# Patient Record
Sex: Male | Born: 1975 | Race: Black or African American | Hispanic: No | Marital: Single | Smoking: Never smoker
Health system: Southern US, Community
[De-identification: ages and names within clinical notes are randomized; demographics above are authoritative.]

---

## 2009-08-04 ENCOUNTER — Emergency Department (HOSPITAL_COMMUNITY): Admission: EM | Admit: 2009-08-04 | Discharge: 2009-08-04 | Payer: Self-pay | Admitting: Family Medicine

## 2011-05-14 ENCOUNTER — Emergency Department (INDEPENDENT_AMBULATORY_CARE_PROVIDER_SITE_OTHER)
Admission: EM | Admit: 2011-05-14 | Discharge: 2011-05-14 | Disposition: A | Discharge status: Home / Self Care | Payer: 59 | Source: Home / Self Care

## 2011-05-14 ENCOUNTER — Encounter (HOSPITAL_COMMUNITY): Payer: Self-pay

## 2011-05-14 DIAGNOSIS — J069 Acute upper respiratory infection, unspecified: Secondary | ICD-10-CM

## 2011-05-14 DIAGNOSIS — H6120 Impacted cerumen, unspecified ear: Secondary | ICD-10-CM

## 2011-05-14 DIAGNOSIS — R0789 Other chest pain: Secondary | ICD-10-CM

## 2011-05-14 DIAGNOSIS — R071 Chest pain on breathing: Secondary | ICD-10-CM

## 2011-05-14 MED ORDER — IBUPROFEN 800 MG PO TABS
800.0000 mg | ORAL_TABLET | Freq: Once | ORAL | Status: AC
  Administered 2011-05-14: 800 mg via ORAL

## 2011-05-14 MED ORDER — IBUPROFEN 800 MG PO TABS: 800.0000 mg | ORAL_TABLET | Freq: Three times a day (TID) | ORAL | Status: AC

## 2011-05-14 MED ORDER — IBUPROFEN 800 MG PO TABS
ORAL_TABLET | ORAL | Status: AC
  Filled 2011-05-14: qty 1

## 2011-05-14 NOTE — ED Provider Notes (Signed)
History     CSN: [id number]  Arrival date & time 05/14/11  0818   None     Chief Complaint  Patient presents with  . URI    (Consider location/radiation/quality/duration/timing/severity/associated sxs/prior treatment) HPI Comments: Patient presents with complaints of nasal congestion, sneezing and cough for the last 5 days. He states when he sneezes or coughs his chest hurts. He has no dyspnea or wheezing. He is also beginning to develop some left ear discomfort as well. He has no rhinorrhea. His cough is nonproductive as well. He has been taking over-the-counter cough medication without improvement.   History reviewed. No pertinent past medical history.  History reviewed. No pertinent past surgical history.  History reviewed. No pertinent family history.  History  Substance Use Topics  . Smoking status: Never Smoker   . Smokeless tobacco: Not on file  . Alcohol Use: No      Review of Systems  Constitutional: Negative for fever, chills and fatigue.  HENT: Positive for ear pain, congestion and sneezing. Negative for sore throat, rhinorrhea, postnasal drip and sinus pressure.   Respiratory: Positive for cough. Negative for shortness of breath and wheezing.     Allergies  Review of patient's allergies indicates no known allergies.  Home Medications   Current Outpatient Rx  Name Route Sig Dispense Refill  . IBUPROFEN 800 MG PO TABS Oral Take 1 tablet (800 mg total) by mouth 3 (three) times daily. 15 tablet 0    BP 140/86  Pulse 85  Temp(Src) 98.4 F (36.9 C) (Oral)  SpO2 98%  Physical Exam  Nursing note and vitals reviewed. Constitutional: He appears well-developed and well-nourished. No distress.  HENT:  Head: Normocephalic and atraumatic.  Right Ear: External ear normal.  Left Ear: External ear normal.  Nose: Nose normal.  Mouth/Throat: Uvula is midline, oropharynx is clear and moist and mucous membranes are normal. No oropharyngeal exudate, posterior  oropharyngeal edema or posterior oropharyngeal erythema.       Bilat EAC obstructed with soft cerumen, Lt > Rt. After ear irrigation bilat TMs and EAC neg.   Neck: Neck supple.  Cardiovascular: Normal rate, regular rhythm and normal heart sounds.   Pulmonary/Chest: Effort normal and breath sounds normal. No respiratory distress. He exhibits no tenderness.  Lymphadenopathy:    He has no cervical adenopathy.  Neurological: He is alert.  Skin: Skin is warm and dry.  Psychiatric: He has a normal mood and affect.    ED Course  Procedures (including critical care time)  Labs Reviewed - No data to display No results found.   1. Acute URI   2. Cerumen impaction   3. Chest wall pain       MDM  Cerumen impaction cleared with ear irrigation. Chest wall pain with cough and sneeze.         Melody Comas, [place] 05/14/11 [phone]

## 2011-05-14 NOTE — ED Notes (Signed)
C/o runny nose, stuffy nose, cough, headache, lt ear pain and chest hurts with coughing.  Sx started on Sunday with runny nose and nasal congestion and have progressively worsened.  No known fever.

## 2011-05-17 NOTE — ED Provider Notes (Signed)
Medical screening examination/treatment/procedure(s) were performed by non-physician practitioner and as supervising physician I was immediately available for consultation/collaboration.   Elaysia Devargas DOUGLAS MD.    Damascus Feldpausch Douglas Sherl Yzaguirre, MD 05/17/11 1346 

## 2011-05-30 ENCOUNTER — Ambulatory Visit (INDEPENDENT_AMBULATORY_CARE_PROVIDER_SITE_OTHER): Payer: 59 | Admitting: Internal Medicine

## 2011-05-30 ENCOUNTER — Encounter: Payer: Self-pay | Admitting: Internal Medicine

## 2011-05-30 VITALS — BP 137/84 | HR 85 | Temp 98.3°F | Resp 16 | Ht 70.0 in | Wt 270.0 lb

## 2011-05-30 DIAGNOSIS — K047 Periapical abscess without sinus: Secondary | ICD-10-CM

## 2011-05-30 MED ORDER — AMOXICILLIN 500 MG PO CAPS: 1000.0000 mg | ORAL_CAPSULE | Freq: Two times a day (BID) | ORAL | Status: AC

## 2011-05-30 MED ORDER — AMOXICILLIN 500 MG PO CAPS: 500.0000 mg | ORAL_CAPSULE | Freq: Three times a day (TID) | ORAL | Status: DC

## 2011-05-30 MED ORDER — HYDROCODONE-ACETAMINOPHEN 7.5-750 MG PO TABS: 1.0000 | ORAL_TABLET | Freq: Three times a day (TID) | ORAL | Status: AC | PRN

## 2011-05-30 NOTE — Progress Notes (Signed)
Is 36 year old man presents with progressive pain in his left jaw left ear and the left side of his face over the past 7-10 days. This follows an upper inspiratory infection for which she had an ER visit at which time only ibuprofen was prescribed. The pain is so intense he couldn't sleep last night. He has a regular dentist  He is on no medicines and has no current illnesses   Exam reveals a swollen red gum, around the lower left molar that is very tender to touch there is no pointing abscess to [facility] Dear tender nodes at the left anterior cervical area high angle of the jaw The nares are clear and the left ear is clear.   Problem #1 dental abscess Amoxicillin and hydrocodone will be used and he will follow up with his dentist

## 2011-09-23 ENCOUNTER — Encounter (HOSPITAL_COMMUNITY): Payer: Self-pay | Admitting: Emergency Medicine

## 2011-09-23 ENCOUNTER — Emergency Department (HOSPITAL_COMMUNITY)
Admission: EM | Admit: 2011-09-23 | Discharge: 2011-09-23 | Disposition: A | Discharge status: Home / Self Care | Payer: 59 | Source: Home / Self Care

## 2011-09-23 DIAGNOSIS — R059 Cough, unspecified: Secondary | ICD-10-CM

## 2011-09-23 DIAGNOSIS — R05 Cough: Secondary | ICD-10-CM

## 2011-09-23 MED ORDER — FLUTICASONE PROPIONATE 50 MCG/ACT NA SUSP: 2.0000 | Freq: Every day | NASAL | Status: DC

## 2011-09-23 MED ORDER — ESOMEPRAZOLE MAGNESIUM 40 MG PO CPDR: 40.0000 mg | DELAYED_RELEASE_CAPSULE | Freq: Every day | ORAL | Status: AC

## 2011-09-23 MED ORDER — GUAIFENESIN-CODEINE 100-10 MG/5ML PO SYRP: 5.0000 mL | ORAL_SOLUTION | Freq: Three times a day (TID) | ORAL | Status: AC | PRN

## 2011-09-23 NOTE — Discharge Instructions (Signed)
Thank you for coming in today. I think the most likely explanation for your cough is allergies. Please take Flonase daily for at least one month. Please also try Nexium for heartburn, as this may be a cause of cough.  If you do not get better within one month please make an appointment for the family practice. When you call [phone] make sure to tell them that your employee of [employer]. Call or go to the emergency room if you get worse, have trouble breathing, have chest pains, or palpitations.

## 2011-09-23 NOTE — ED Provider Notes (Signed)
Tyler Hensley is a 36 y.o. male who presents to Urgent Care today for cough for 3 weeks. Patient notes a cough productive of clear sputum. He denies any trouble breathing chest pains or palpitations. He had some allergy symptoms initially however those have resolved. He has tried Claritin and several other over-the-counter cough medicines that have not helped. He denies any heartburn nausea vomiting or diarrhea. He also denies any wheezing.    PMH reviewed. Otherwise healthy young man History  Substance Use Topics  . Smoking status: Never Smoker   . Smokeless tobacco: Not on file  . Alcohol Use: No   ROS as above Medications reviewed. No current facility-administered medications for this encounter.   Current Outpatient Prescriptions  Medication Sig Dispense Refill  . OVER THE COUNTER MEDICATION dayquil      . esomeprazole (NEXIUM) 40 MG capsule Take 1 capsule (40 mg total) by mouth daily before breakfast.  30 capsule  3  . fluticasone (FLONASE) 50 MCG/ACT nasal spray Place 2 sprays into the nose daily.  16 g  2  . guaiFENesin-codeine (ROBITUSSIN AC) 100-10 MG/5ML syrup Take 5 mLs by mouth 3 (three) times daily as needed for cough.  120 mL  0    Exam:  BP 138/98  Pulse 88  Temp(Src) 98.6 F (37 C) (Oral)  Resp 22  SpO2 99% Gen: Well NAD HEENT: EOMI,  MMM, posterior pharynx shows cobblestoning Lungs: CTABL Nl WOB Heart: RRR no MRG Abd: NABS, NT, ND Exts: Non edematous BL  LE, warm and well perfused.   No results found for this or any previous visit (from the past 24 hour(s)). No results found.  Assessment and Plan: 36 y.o. male with cough. I think the most likely sedation for his cough is postnasal drip. Plan to treat with Flonase for at least one month and followup at [facility] as needed. Additionally and other common causes of chronic cough is reflux. We'll try treating empirically with Nexium. Discussed the plan with patient who expresses  understanding. Additionally discuss red flag signs or symptoms. Please see discharge instructions.     Rodolph Bong, MD 09/23/11 2019

## 2011-09-23 NOTE — ED Notes (Addendum)
initially a headache, cough, then runny nose, chest congestion, cough for 3 weeks.  Cough will not go away.  Coughing up yellow phlegm

## 2011-09-24 NOTE — ED Provider Notes (Signed)
Medical screening examination/treatment/procedure(s) were performed by the resident and as supervising physician I was immediately available for consultation/collaboration.  Maleeha Halls M. MD  Xylon Croom M Telecia Larocque, MD 09/24/11 1255 

## 2013-07-06 ENCOUNTER — Emergency Department (INDEPENDENT_AMBULATORY_CARE_PROVIDER_SITE_OTHER): Payer: 59

## 2013-07-06 ENCOUNTER — Emergency Department (HOSPITAL_COMMUNITY)
Admission: EM | Admit: 2013-07-06 | Discharge: 2013-07-06 | Disposition: A | Discharge status: Home / Self Care | Payer: 59 | Source: Home / Self Care | Attending: Family Medicine | Admitting: Family Medicine

## 2013-07-06 ENCOUNTER — Encounter (HOSPITAL_COMMUNITY): Payer: Self-pay | Admitting: Emergency Medicine

## 2013-07-06 DIAGNOSIS — J40 Bronchitis, not specified as acute or chronic: Secondary | ICD-10-CM

## 2013-07-06 MED ORDER — HYDROCODONE-HOMATROPINE 5-1.5 MG/5ML PO SYRP: 5.0000 mL | ORAL_SOLUTION | Freq: Four times a day (QID) | ORAL | Status: DC | PRN

## 2013-07-06 MED ORDER — AZITHROMYCIN 250 MG PO TABS
250.0000 mg | ORAL_TABLET | Freq: Every day | ORAL | Status: DC
Start: 2013-07-06 — End: 2017-12-14

## 2013-07-06 NOTE — ED Provider Notes (Signed)
CSN: [id number]     Arrival date & time 07/06/13  1720 History   First MD Initiated Contact with Patient 07/06/13 1843     Chief Complaint  Patient presents with  . URI   (Consider location/radiation/quality/duration/timing/severity/associated sxs/prior Treatment) Patient is a 38 y.o. male presenting with URI. The history is provided by the patient. No language interpreter was used.  URI Presenting symptoms: congestion and cough   Severity:  Moderate Onset quality:  Gradual Duration:  7 days Timing:  Constant Progression:  Worsening Chronicity:  New Relieved by:  Nothing Worsened by:  Nothing tried Ineffective treatments:  None tried Risk factors: sick contacts   Risk factors: not elderly     History reviewed. No pertinent past medical history. History reviewed. No pertinent past surgical history. History reviewed. No pertinent family history. History  Substance Use Topics  . Smoking status: Never Smoker   . Smokeless tobacco: Not on file  . Alcohol Use: No    Review of Systems  HENT: Positive for congestion.   Respiratory: Positive for cough.   All other systems reviewed and are negative.    Allergies  Review of patient's allergies indicates no known allergies.  Home Medications   Current Outpatient Rx  Name  Route  Sig  Dispense  Refill  . OVER THE COUNTER MEDICATION      dayquil         . EXPIRED: esomeprazole (NEXIUM) 40 MG capsule   Oral   Take 1 capsule (40 mg total) by mouth daily before breakfast.   30 capsule   3   . EXPIRED: fluticasone (FLONASE) 50 MCG/ACT nasal spray   Nasal   Place 2 sprays into the nose daily.   16 g   2    BP 141/84  Pulse 103  Temp(Src) 98.5 F (36.9 C) (Oral)  Resp 16  SpO2 98% Physical Exam  Nursing note and vitals reviewed. Constitutional: He is oriented to person, place, and time. He appears well-developed and well-nourished.  HENT:  Head: Normocephalic and atraumatic.  Eyes: Conjunctivae and EOM are  normal. Pupils are equal, round, and reactive to light.  Neck: Normal range of motion.  Cardiovascular: Normal rate and regular rhythm.   Pulmonary/Chest: Effort normal and breath sounds normal.  Abdominal: Soft. He exhibits no distension.  Musculoskeletal: Normal range of motion.  Neurological: He is alert and oriented to person, place, and time.  Skin: Skin is warm.  Psychiatric: He has a normal mood and affect.    ED Course  Procedures (including critical care time) Labs Review Labs Reviewed - No data to display Imaging Review Dg Chest 2 View  07/06/2013   CLINICAL DATA:  Cough since 06/29/2013, fever, URI  EXAM: CHEST  2 VIEW  COMPARISON:  None  FINDINGS: Normal heart size, mediastinal contours, and pulmonary vascularity.  Lungs clear.  Bones unremarkable.  No pneumothorax.  IMPRESSION: Normal exam.   Electronically Signed   By: Ulyses SouthwardMark  Boles M.D.   On: 07/06/2013 19:10     MDM   1. Bronchitis    No pneumonia.   Pt given rx for zithromax and hydromet for cough.   I advised recheck if symptoms worsen or change.    Lonia SkinnerLeslie K GlenvilleSofia, PA-C 07/06/13 2005

## 2013-07-06 NOTE — Discharge Instructions (Signed)

## 2013-07-06 NOTE — ED Notes (Signed)
C/o  headache on 3/13, with runny nose, cough, sneezing.  Vomited x 5 last night.  No diarrhea.  C/o bil. flank pain.  Fever on Sat.

## 2013-07-07 NOTE — ED Provider Notes (Signed)
Medical screening examination/treatment/procedure(s) were performed by a resident physician or non-physician practitioner and as the supervising physician I was immediately available for consultation/collaboration.  Marshelle Bilger, MD    Nikolis Berent S Christiann Hagerty, MD 07/07/13 0843 

## 2015-01-28 ENCOUNTER — Ambulatory Visit (HOSPITAL_BASED_OUTPATIENT_CLINIC_OR_DEPARTMENT_OTHER): Payer: 59 | Attending: Family Medicine | Admitting: Radiology

## 2015-01-28 DIAGNOSIS — G4710 Hypersomnia, unspecified: Secondary | ICD-10-CM | POA: Insufficient documentation

## 2015-01-28 DIAGNOSIS — G4733 Obstructive sleep apnea (adult) (pediatric): Secondary | ICD-10-CM | POA: Insufficient documentation

## 2015-01-28 DIAGNOSIS — R0683 Snoring: Secondary | ICD-10-CM | POA: Insufficient documentation

## 2015-02-08 DIAGNOSIS — G4733 Obstructive sleep apnea (adult) (pediatric): Secondary | ICD-10-CM | POA: Diagnosis not present

## 2015-02-08 DIAGNOSIS — R0683 Snoring: Secondary | ICD-10-CM | POA: Diagnosis not present

## 2015-02-08 NOTE — Progress Notes (Signed)
  Patient Name: Tyler Hensley, Tyler Hensley Study Date: 01/28/2015 Gender: Male D.O.B: 29-Apr-1975 Age (years): 38 Referring Provider: Farris HasAaron Morrow Height (inches): 69 Interpreting Physician: Jetty Duhamellinton Young MD, ABSM Weight (lbs): 300 RPSGT: [place], [place] BMI: 44 MRN: [id number] Neck Size: 17.00 CLINICAL INFORMATION Sleep Study Type: Unattended Home Sleep Test   Indication for sleep study: 780.54 Hypersomnia, OSA, Snoring   Epworth Sleepiness Score: 9 SLEEP STUDY TECHNIQUE A multi-channel overnight portable sleep study was performed. The channels recorded were: nasal airflow, thoracic respiratory movement, and oxygen saturation with a pulse oximetry. Snoring was also monitored. MEDICATIONS Patient self administered medications include: none reported.  SLEEP ARCHITECTURE Patient was studied for 388.2 minutes. The sleep efficiency was 99.9 % and the patient was supine for 100%. The arousal index was 0.0 per hour.  RESPIRATORY PARAMETERS The overall AHI was 6.2 per hour, with a central apnea index of 1.1 per hour. The oxygen nadir was 88% during sleep.  CARDIAC DATA Mean heart rate during sleep was 83.8 bpm.  IMPRESSIONS - Mild obstructive sleep apnea occurred during this study (AHI = 6.2/h). - No significant central sleep apnea occurred during this study (CAI = 1.1/h). - Mild oxygen desaturation was noted during this study (Min O2 = 88%). - Patient snored 47.6% during the sleep.  DIAGNOSIS - Obstructive Sleep Apnea (327.23 [G47.33 ICD-10])  RECOMMENDATIONS - If conservative measures are not sufficient, then an oral appliance or CPAP might be considered on an individual basis. - Positional therapy avoiding supine position during sleep. - Avoid alcohol, sedatives and other CNS depressants that may worsen sleep apnea and disrupt normal sleep architecture. - Sleep hygiene should be reviewed to assess factors that may improve sleep quality. - Weight management and regular exercise  should be initiated or continued. Waymon Budge-   YOUNG,CLINTON D Diplomate, American Board of Sleep Medicine  ELECTRONICALLY SIGNED ON:  02/08/2015, 10:08 AM [facility] PH: [phone]   FX: [phone] ACCREDITED BY THE AMERICAN ACADEMY OF SLEEP MEDICINE

## 2015-05-10 DIAGNOSIS — G4733 Obstructive sleep apnea (adult) (pediatric): Secondary | ICD-10-CM | POA: Diagnosis not present

## 2015-06-10 DIAGNOSIS — G4733 Obstructive sleep apnea (adult) (pediatric): Secondary | ICD-10-CM | POA: Diagnosis not present

## 2015-07-08 DIAGNOSIS — G4733 Obstructive sleep apnea (adult) (pediatric): Secondary | ICD-10-CM | POA: Diagnosis not present

## 2015-07-29 DIAGNOSIS — G4733 Obstructive sleep apnea (adult) (pediatric): Secondary | ICD-10-CM | POA: Diagnosis not present

## 2015-08-08 DIAGNOSIS — G4733 Obstructive sleep apnea (adult) (pediatric): Secondary | ICD-10-CM | POA: Diagnosis not present

## 2015-09-07 DIAGNOSIS — G4733 Obstructive sleep apnea (adult) (pediatric): Secondary | ICD-10-CM | POA: Diagnosis not present

## 2015-09-29 DIAGNOSIS — Z0000 Encounter for general adult medical examination without abnormal findings: Secondary | ICD-10-CM | POA: Diagnosis not present

## 2015-09-29 DIAGNOSIS — Z13220 Encounter for screening for lipoid disorders: Secondary | ICD-10-CM | POA: Diagnosis not present

## 2015-09-29 DIAGNOSIS — Z131 Encounter for screening for diabetes mellitus: Secondary | ICD-10-CM | POA: Diagnosis not present

## 2015-09-29 DIAGNOSIS — R05 Cough: Secondary | ICD-10-CM | POA: Diagnosis not present

## 2015-09-29 DIAGNOSIS — E669 Obesity, unspecified: Secondary | ICD-10-CM | POA: Diagnosis not present

## 2015-09-29 MED FILL — FLUTICASONE PROP 50 MCG SPR: 50 | 60 days supply | Qty: 16 | Fill #0

## 2015-09-29 MED FILL — AZITHROMYCIN 250 MG TABLET: 250 | 5 days supply | Qty: 6 | Fill #0

## 2015-10-25 DIAGNOSIS — H52223 Regular astigmatism, bilateral: Secondary | ICD-10-CM | POA: Diagnosis not present

## 2015-10-25 DIAGNOSIS — H5203 Hypermetropia, bilateral: Secondary | ICD-10-CM | POA: Diagnosis not present

## 2016-02-10 DIAGNOSIS — J069 Acute upper respiratory infection, unspecified: Secondary | ICD-10-CM | POA: Diagnosis not present

## 2016-02-10 DIAGNOSIS — B9789 Other viral agents as the cause of diseases classified elsewhere: Secondary | ICD-10-CM | POA: Diagnosis not present

## 2016-05-07 DIAGNOSIS — R197 Diarrhea, unspecified: Secondary | ICD-10-CM | POA: Diagnosis not present

## 2016-05-11 DIAGNOSIS — K529 Noninfective gastroenteritis and colitis, unspecified: Secondary | ICD-10-CM | POA: Diagnosis not present

## 2016-06-16 ENCOUNTER — Other Ambulatory Visit: Payer: Self-pay
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
## 2016-06-21 ENCOUNTER — Other Ambulatory Visit: Payer: Self-pay | Admitting: *Deleted

## 2016-06-21 ENCOUNTER — Encounter: Payer: Self-pay | Admitting: *Deleted

## 2016-06-21 NOTE — Patient Outreach (Signed)
[employer] [facility]) Care Management  06/21/2016  Tyler Hensley 1975-09-24 [id number]   CSW was able to make initial contact with patient today to perform phone assessment, as well as assess and assist with social work needs and services.  CSW introduced self, explained role and types of services provided through [facility] ([facility]).  CSW further explained to patient that CSW works with patient's RNCM, also with [place] [facility], Tyler Hensley. CSW then explained the reason for the call, indicating that Tyler Hensley thought that patient would benefit from social work services and resources to assist with counseling and supportive services, as well as a referral for medication management and psychotherapy.  CSW obtained two HIPAA compliant identifiers from patient, which included patient's name and date of birth. Patient was extremely pleasant on the phone and most appreciative of the call.  Patient admitted to having a "bad day" when he last spoke with Tyler Hensley, reporting that he has "since worked it out in his mind".  Patient went on to say that his reasons for feeling depressed "came to him in a dream over the weekend".  Patient stated, "I know it sounds crazy, but in my dream, I realized that it's during the most drastic changes in my life that I become unsettled".  Patient indicated that he was referring to the fact that he will be turning "41" this year, which is a "huge milestone" in his mind.  Patient admitted to going through the same type of depression when he turned "59" years of age.  Patient denied feeling homicidal and/or suicidal, admitting that his wife, Tyler Hensley would be his deterrent from ever being able to harm himself.  CSW inquired as to how patient plans to cope with this realization.  Patient reported, "Well now that I know why I am having these feelings, I can learn to deal with them".  CSW spoke with patient about deep  breathing exercises, as well as relaxation techniques, for when patient becomes overwhelmed, or when feelings of depression become too much for him to bare. Patient was not interested in receiving a referral for counseling and/or supportive services, nor was patient interested in a list of counselors/therapists that accept [facility].  Patient denied being on any type of psychotropic medications, which CSW was able to confirm, and did not appear to be interested in having anything prescribed for him by his primary care physician.   Patient did not wish for CSW to contact him at his home number, reporting that Mrs. Hensley is unaware of his recent feelings of depression and suicidality, nor was patient agreeable to having CSW mail any resource material to his home address, for fear that Mrs. Hensley would open the packet and review the contents, becoming upset with him for not disclosing his feelings to her directly.  Patient was agreeable to having CSW email him the following list of resource information to his [place] account:   EACP ([place]) - # [phone] and/or toll free - # [phone] [facility] ([facility]) [facility] - # [phone] EACP entitles patient to three free visits, with a referral for continued services, if necessary.  [facility] requires a $20-$25.00 copay per visit, with unlimited visits.   Deep Breathing Exercises Relaxation Techniques Signs and Symptoms of Depression Treatment Techniques for Coping with Depression CSW was able to confirm that patient received the resource information emailed to him by CSW.  Patient denied need for follow-up from [place], but agreed to take down CSW's contact information, in the event that patient had questions or needed social work assistance in the near future. CSW will perform a case closure on patient, as all goals of treatment have been met from social work  standpoint and no additional social work needs have been identified at this time.  CSW will notify patient's RNCM with [facility], Tyler Hensley of CSW's plans to close patient's case.  CSW will submit a case closure request to Josepha Pigg, Care Management Assistant with [place] [facility], in the form of an [employer].   Tyler Hensley, BSW, MSW, LCSW  Licensed Education officer, [facility]  [address] [place] [address] [place], [place] [address] Physical Address-[address], [place], [place] [address] Toll Free Main # [phone] Fax # [phone] Cell # [phone]  Office # [phone] [email or web address]

## 2016-10-04 DIAGNOSIS — E669 Obesity, unspecified: Secondary | ICD-10-CM | POA: Diagnosis not present

## 2016-10-04 DIAGNOSIS — Z0000 Encounter for general adult medical examination without abnormal findings: Secondary | ICD-10-CM | POA: Diagnosis not present

## 2016-10-13 DIAGNOSIS — R05 Cough: Secondary | ICD-10-CM | POA: Diagnosis not present

## 2016-10-13 MED FILL — AZITHROMYCIN 250 MG TAB: 250 | 5 days supply | Qty: 6 | Fill #0

## 2016-10-13 MED FILL — PROMETHAZINE-CODEINE SYRUP: 6.25-10 | 5 days supply | Qty: 100 | Fill #0

## 2017-06-12 DIAGNOSIS — M791 Myalgia, unspecified site: Secondary | ICD-10-CM | POA: Diagnosis not present

## 2017-06-12 DIAGNOSIS — B349 Viral infection, unspecified: Secondary | ICD-10-CM | POA: Diagnosis not present

## 2017-06-12 DIAGNOSIS — R509 Fever, unspecified: Secondary | ICD-10-CM | POA: Diagnosis not present

## 2017-07-07 DIAGNOSIS — R062 Wheezing: Secondary | ICD-10-CM | POA: Diagnosis not present

## 2017-07-07 DIAGNOSIS — J069 Acute upper respiratory infection, unspecified: Secondary | ICD-10-CM | POA: Diagnosis not present

## 2017-07-07 MED FILL — AZITHROMYCIN 250 MG TABS: 250 | 5 days supply | Qty: 6 | Fill #0

## 2017-07-07 MED FILL — VENTOLIN HFA 90 MCG INHALER: 108 (90 BAS | 25 days supply | Qty: 18 | Fill #0

## 2017-09-27 DIAGNOSIS — R002 Palpitations: Secondary | ICD-10-CM | POA: Diagnosis not present

## 2017-10-06 DIAGNOSIS — Z131 Encounter for screening for diabetes mellitus: Secondary | ICD-10-CM | POA: Diagnosis not present

## 2017-10-06 DIAGNOSIS — Z0000 Encounter for general adult medical examination without abnormal findings: Secondary | ICD-10-CM | POA: Diagnosis not present

## 2017-10-06 DIAGNOSIS — Z136 Encounter for screening for cardiovascular disorders: Secondary | ICD-10-CM | POA: Diagnosis not present

## 2017-10-06 DIAGNOSIS — Z125 Encounter for screening for malignant neoplasm of prostate: Secondary | ICD-10-CM | POA: Diagnosis not present

## 2017-10-06 DIAGNOSIS — E669 Obesity, unspecified: Secondary | ICD-10-CM | POA: Diagnosis not present

## 2017-10-06 DIAGNOSIS — Z6841 Body Mass Index (BMI) 40.0 and over, adult: Secondary | ICD-10-CM | POA: Diagnosis not present

## 2017-12-05 ENCOUNTER — Encounter (INDEPENDENT_AMBULATORY_CARE_PROVIDER_SITE_OTHER): Payer: Self-pay

## 2017-12-14 ENCOUNTER — Ambulatory Visit: Payer: Self-pay | Admitting: Nurse Practitioner

## 2017-12-14 VITALS — BP 130/88 | HR 99 | Temp 99.0°F | Resp 18 | Wt 293.8 lb

## 2017-12-14 DIAGNOSIS — J019 Acute sinusitis, unspecified: Secondary | ICD-10-CM

## 2017-12-14 MED ORDER — AMOXICILLIN-POT CLAVULANATE 875-125 MG PO TABS: 1.0000 | ORAL_TABLET | Freq: Two times a day (BID) | ORAL | 0 refills | Status: AC

## 2017-12-14 MED ORDER — PSEUDOEPH-BROMPHEN-DM 30-2-10 MG/5ML PO SYRP: 5.0000 mL | ORAL_SOLUTION | Freq: Four times a day (QID) | ORAL | 0 refills | Status: AC | PRN

## 2017-12-14 MED ORDER — MONTELUKAST SODIUM 10 MG PO TABS: 10.0000 mg | ORAL_TABLET | Freq: Every day | ORAL | 0 refills | Status: DC

## 2017-12-14 MED FILL — AMOX-CLAV 875-125 MG TABLET: 875-125 | 10 days supply | Qty: 20 | Fill #0

## 2017-12-14 MED FILL — BROMPHENIR-PSEUDOEPHED-DM S: 30-2-10 | 7 days supply | Qty: 150 | Fill #0

## 2017-12-14 MED FILL — MONTELUKAST SOD 10 MG TAB: 10 | 30 days supply | Qty: 30 | Fill #0

## 2017-12-14 NOTE — Patient Instructions (Signed)
Sinusitis, Adult -Take medications as prescribed. -Fluticasone nasal spray, 2 sprays in each nostril daily for 10 days. (patient has OTC brand he will use). -Increase fluids. -Ibuprofen or Tylenol for pain, fever or general discomfort. -Use humidifier or vaporizer during sleep and while awake until symptoms improve. -Sleep elevated on at least 2 pillows. -Use a teaspoon of honey or cough drops for cough. -Follow up as needed.   Sinusitis is soreness and inflammation of your sinuses. Sinuses are hollow spaces in the bones around your face. Your sinuses are located:  Around your eyes.  In the middle of your forehead.  Behind your nose.  In your cheekbones.  Your sinuses and nasal passages are lined with a stringy fluid (mucus). Mucus normally drains out of your sinuses. When your nasal tissues become inflamed or swollen, the mucus can become trapped or blocked so air cannot flow through your sinuses. This allows bacteria, viruses, and funguses to grow, which leads to infection. Sinusitis can develop quickly and last for 7?10 days (acute) or for more than 12 weeks (chronic). Sinusitis often develops after a cold. What are the causes? This condition is caused by anything that creates swelling in the sinuses or stops mucus from draining, including:  Allergies.  Asthma.  Bacterial or viral infection.  Abnormally shaped bones between the nasal passages.  Nasal growths that contain mucus (nasal polyps).  Narrow sinus openings.  Pollutants, such as chemicals or irritants in the air.  A foreign object stuck in the nose.  A fungal infection. This is rare.  What increases the risk? The following factors may make you more likely to develop this condition:  Having allergies or asthma.  Having had a recent cold or respiratory tract infection.  Having structural deformities or blockages in your nose or sinuses.  Having a weak immune system.  Doing a lot of swimming or  diving.  Overusing nasal sprays.  Smoking.  What are the signs or symptoms? The main symptoms of this condition are pain and a feeling of pressure around the affected sinuses. Other symptoms include:  Upper toothache.  Earache.  Headache.  Bad breath.  Decreased sense of smell and taste.  A cough that may get worse at night.  Fatigue.  Fever.  Thick drainage from your nose. The drainage is often green and it may contain pus (purulent).  Stuffy nose or congestion.  Postnasal drip. This is when extra mucus collects in the throat or back of the nose.  Swelling and warmth over the affected sinuses.  Sore throat.  Sensitivity to light.  How is this diagnosed? This condition is diagnosed based on symptoms, a medical history, and a physical exam. To find out if your condition is acute or chronic, your health care provider may:  Look in your nose for signs of nasal polyps.  Tap over the affected sinus to check for signs of infection.  View the inside of your sinuses using an imaging device that has a light attached (endoscope).  If your health care provider suspects that you have chronic sinusitis, you may also:  Be tested for allergies.  Have a sample of mucus taken from your nose (nasal culture) and checked for bacteria.  Have a mucus sample examined to see if your sinusitis is related to an allergy.  If your sinusitis does not respond to treatment and it lasts longer than 8 weeks, you may have an MRI or CT scan to check your sinuses. These scans also help to determine how  severe your infection is. In rare cases, a bone biopsy may be done to rule out more serious types of fungal sinus disease. How is this treated? Treatment for sinusitis depends on the cause and whether your condition is chronic or acute. If a virus is causing your sinusitis, your symptoms will go away on their own within 10 days. You may be given medicines to relieve your symptoms,  including:  Topical nasal decongestants. They shrink swollen nasal passages and let mucus drain from your sinuses.  Antihistamines. These drugs block inflammation that is triggered by allergies. This can help to ease swelling in your nose and sinuses.  Topical nasal corticosteroids. These are nasal sprays that ease inflammation and swelling in your nose and sinuses.  Nasal saline washes. These rinses can help to get rid of thick mucus in your nose.  If your condition is caused by bacteria, you will be given an antibiotic medicine. If your condition is caused by a fungus, you will be given an antifungal medicine. Surgery may be needed to correct underlying conditions, such as narrow nasal passages. Surgery may also be needed to remove polyps. Follow these instructions at home: Medicines  Take, use, or apply over-the-counter and prescription medicines only as told by your health care provider. These may include nasal sprays.  If you were prescribed an antibiotic medicine, take it as told by your health care provider. Do not stop taking the antibiotic even if you start to feel better. Hydrate and Humidify  Drink enough water to keep your urine clear or pale yellow. Staying hydrated will help to thin your mucus.  Use a cool mist humidifier to keep the humidity level in your home above 50%.  Inhale steam for 10-15 minutes, 3-4 times a day or as told by your health care provider. You can do this in the bathroom while a hot shower is running.  Limit your exposure to cool or dry air. Rest  Rest as much as possible.  Sleep with your head raised (elevated).  Make sure to get enough sleep each night. General instructions  Apply a warm, moist washcloth to your face 3-4 times a day or as told by your health care provider. This will help with discomfort.  Wash your hands often with soap and water to reduce your exposure to viruses and other germs. If soap and water are not available, use hand  sanitizer.  Do not smoke. Avoid being around people who are smoking (secondhand smoke).  Keep all follow-up visits as told by your health care provider. This is important. Contact a health care provider if:  You have a fever.  Your symptoms get worse.  Your symptoms do not improve within 10 days. Get help right away if:  You have a severe headache.  You have persistent vomiting.  You have pain or swelling around your face or eyes.  You have vision problems.  You develop confusion.  Your neck is stiff.  You have trouble breathing. This information is not intended to replace advice given to you by your health care provider. Make sure you discuss any questions you have with your health care provider. Document Released: 04/05/2005 Document Revised: 11/30/2015 Document Reviewed: 01/29/2015 Elsevier Interactive Patient Education  [employer].

## 2017-12-14 NOTE — Progress Notes (Signed)
Subjective:  Tyler Hensley is a 42 y.o. male who presents for evaluation of possible sinusitis.  Symptoms include headache described as aching, nasal congestion, productive cough with yellow colored sputum and sore throat.  Onset of symptoms was 2 weeks ago, and has been rapidly worsening since that time.  Treatment to date:  rest and Benadryl, Pseudophed, Afrin.  High risk factors for influenza complications:  none.  The following portions of the patient's history were reviewed and updated as appropriate:  allergies, current medications and past medical history.  Constitutional: positive for fatigue, negative for anorexia, chills, malaise and sweats Eyes: negative Ears, nose, mouth, throat, and face: positive for nasal congestion and sore throat, negative for ear drainage, earaches and hoarseness Respiratory: positive for cough, sputum and wheezing, negative for asthma, chronic bronchitis, dyspnea on exertion and emphysema Cardiovascular: negative Gastrointestinal: negative Neurological: positive for headaches, negative for coordination problems, dizziness, paresthesia, speech problems, tremors, vertigo and weakness Allergic/Immunologic: positive for hay fever Objective:  BP 130/88 (BP Location: Right Arm, Patient Position: Sitting, Cuff Size: Large)   Pulse 99   Temp 99 F (37.2 C) (Oral)   Resp 18   Wt 293 lb 12.8 oz (133.3 kg)   SpO2 99%   BMI 43.39 kg/m  General appearance: alert, cooperative, fatigued and no distress Head: Normocephalic, without obvious abnormality, atraumatic Eyes: conjunctivae/corneas clear. PERRL, EOM's intact. Fundi benign. Ears: normal TM's and external ear canals both ears Nose: clear discharge, turbinates swollen, inflamed, no sinus tenderness Throat: lips, mucosa, and tongue normal; teeth and gums normal Lungs: clear to auscultation bilaterally Heart: regular rate and rhythm, S1, S2 normal, no murmur, click, rub or gallop Abdomen: soft, non-tender;  bowel sounds normal; no masses,  no organomegaly Pulses: 2+ and symmetric Skin: Skin color, texture, turgor normal. No rashes or lesions Lymph nodes: cervical, preauricular and submandibular nodes normal Neurologic: Grossly normal    Assessment:  Acute Sinusitis    Plan:  Exam findings, diagnosis etiology and medication use and indications reviewed with patient. Follow- Up and discharge instructions provided. No emergent/urgent issues found on exam.  Patient verbalized understanding of information provided and agrees with plan of care (POC), all questions answered. The patient is advised to call or return to clinic if he does not see an improvement in symptoms, or to seek the care of the closest emergency department if he worsens with the above plan.    1. Acute sinusitis, recurrence not specified, unspecified location  - amoxicillin-clavulanate (AUGMENTIN) 875-125 MG tablet; Take 1 tablet by mouth 2 (two) times daily for 10 days.  Dispense: 20 tablet; Refill: 0 - brompheniramine-pseudoephedrine-DM 30-2-10 MG/5ML syrup; Take 5 mLs by mouth 4 (four) times daily as needed for up to 7 days.  Dispense: 150 mL; Refill: 0 - montelukast (SINGULAIR) 10 MG tablet; Take 1 tablet (10 mg total) by mouth at bedtime.  Dispense: 30 tablet; Refill: 0 -Take medications as prescribed. -Fluticasone nasal spray, 2 sprays in each nostril daily for 10 days. (patient has OTC brand he will use). -Increase fluids. -Ibuprofen or Tylenol for pain, fever or general discomfort. -Use humidifier or vaporizer during sleep and while awake until symptoms improve. -Sleep elevated on at least 2 pillows. -Use a teaspoon of honey or cough drops for cough. -Follow up as needed.

## 2018-01-25 ENCOUNTER — Other Ambulatory Visit: Payer: Self-pay | Admitting: Family Medicine

## 2018-09-04 DIAGNOSIS — M79674 Pain in right toe(s): Secondary | ICD-10-CM | POA: Diagnosis not present

## 2018-09-06 ENCOUNTER — Ambulatory Visit
Admission: RE | Admit: 2018-09-06 | Discharge: 2018-09-06 | Disposition: A | Discharge status: Home / Self Care | Payer: 59 | Source: Ambulatory Visit | Attending: Family Medicine | Admitting: Family Medicine

## 2018-09-06 ENCOUNTER — Other Ambulatory Visit: Payer: Self-pay

## 2018-09-06 ENCOUNTER — Other Ambulatory Visit: Payer: Self-pay | Admitting: Family Medicine

## 2018-09-06 DIAGNOSIS — M79671 Pain in right foot: Secondary | ICD-10-CM | POA: Diagnosis not present

## 2018-09-07 ENCOUNTER — Other Ambulatory Visit: Payer: Self-pay

## 2018-09-07 ENCOUNTER — Encounter (HOSPITAL_COMMUNITY): Payer: Self-pay

## 2018-09-07 ENCOUNTER — Emergency Department (HOSPITAL_COMMUNITY): Payer: 59

## 2018-09-07 ENCOUNTER — Emergency Department (HOSPITAL_COMMUNITY)
Admission: EM | Admit: 2018-09-07 | Discharge: 2018-09-07 | Disposition: A | Discharge status: Home / Self Care | Payer: 59 | Attending: Emergency Medicine | Admitting: Emergency Medicine

## 2018-09-07 DIAGNOSIS — Z79899 Other long term (current) drug therapy: Secondary | ICD-10-CM | POA: Insufficient documentation

## 2018-09-07 DIAGNOSIS — M7989 Other specified soft tissue disorders: Secondary | ICD-10-CM | POA: Diagnosis not present

## 2018-09-07 DIAGNOSIS — L03115 Cellulitis of right lower limb: Secondary | ICD-10-CM | POA: Insufficient documentation

## 2018-09-07 DIAGNOSIS — R600 Localized edema: Secondary | ICD-10-CM | POA: Diagnosis not present

## 2018-09-07 DIAGNOSIS — R609 Edema, unspecified: Secondary | ICD-10-CM

## 2018-09-07 DIAGNOSIS — M79671 Pain in right foot: Secondary | ICD-10-CM | POA: Diagnosis present

## 2018-09-07 LAB — CBC WITH DIFFERENTIAL/PLATELET
Abs Immature Granulocytes: 0.02 10*3/uL (ref 0.00–0.07)
Basophils Absolute: 0.1 10*3/uL (ref 0.0–0.1)
Basophils Relative: 1 %
Eosinophils Absolute: 0.3 10*3/uL (ref 0.0–0.5)
Eosinophils Relative: 3 %
HCT: 44.4 % (ref 39.0–52.0)
Hemoglobin: 14.3 g/dL (ref 13.0–17.0)
Immature Granulocytes: 0 %
Lymphocytes Relative: 30 %
Lymphs Abs: 2.4 10*3/uL (ref 0.7–4.0)
MCH: 27.6 pg (ref 26.0–34.0)
MCHC: 32.2 g/dL (ref 30.0–36.0)
MCV: 85.7 fL (ref 80.0–100.0)
Monocytes Absolute: 0.5 10*3/uL (ref 0.1–1.0)
Monocytes Relative: 6 %
Neutro Abs: 4.9 10*3/uL (ref 1.7–7.7)
Neutrophils Relative %: 60 %
Platelets: 331 10*3/uL (ref 150–400)
RBC: 5.18 MIL/uL (ref 4.22–5.81)
RDW: 12.6 % (ref 11.5–15.5)
WBC: 8.2 10*3/uL (ref 4.0–10.5)
nRBC: 0 % (ref 0.0–0.2)

## 2018-09-07 LAB — BASIC METABOLIC PANEL
Anion gap: 10 (ref 5–15)
BUN: 16 mg/dL (ref 6–20)
CO2: 25 mmol/L (ref 22–32)
Calcium: 9.3 mg/dL (ref 8.9–10.3)
Chloride: 104 mmol/L (ref 98–111)
Creatinine, Ser: 0.98 mg/dL (ref 0.61–1.24)
GFR calc Af Amer: >60 mL/min (ref >60)
GFR calc non Af Amer: >60 mL/min (ref >60)
Glucose, Bld: 117 mg/dL — ABNORMAL HIGH (ref 70–99)
Potassium: 4 mmol/L (ref 3.5–5.1)
Sodium: 139 mmol/L (ref 135–145)

## 2018-09-07 LAB — SEDIMENTATION RATE: Sed Rate: 10 mm/hr (ref 0–16)

## 2018-09-07 LAB — C-REACTIVE PROTEIN: CRP: 1.6 mg/dL — ABNORMAL HIGH (ref <1.0)

## 2018-09-07 MED ORDER — HYDROCODONE-ACETAMINOPHEN 5-325 MG PO TABS: 1.0000 | ORAL_TABLET | ORAL | 0 refills | Status: DC | PRN

## 2018-09-07 MED ORDER — SULFAMETHOXAZOLE-TRIMETHOPRIM 800-160 MG PO TABS: 1.0000 | ORAL_TABLET | Freq: Two times a day (BID) | ORAL | 0 refills | Status: AC

## 2018-09-07 MED ORDER — HYDROCODONE-ACETAMINOPHEN 5-325 MG PO TABS
1.0000 | ORAL_TABLET | Freq: Once | ORAL | Status: AC
  Administered 2018-09-07: 18:00:00 1 via ORAL
  Filled 2018-09-07: qty 1

## 2018-09-07 MED ORDER — CEPHALEXIN 500 MG PO CAPS: 500.0000 mg | ORAL_CAPSULE | Freq: Four times a day (QID) | ORAL | 0 refills | Status: DC

## 2018-09-07 MED FILL — CEPHALEXIN 500 MG CAPSULE: 500 | 7 days supply | Qty: 28 | Fill #0

## 2018-09-07 MED FILL — SULFAMETHOXAZOLE-TMP DS TAB: 800-160 | 7 days supply | Qty: 14 | Fill #0

## 2018-09-07 MED FILL — HYDROCODON-APAP 5-325: 5-325 | 1 days supply | Qty: 6 | Fill #0

## 2018-09-07 NOTE — Consult Note (Signed)
Reason for Consult:Right foot wound Referring Physician: Ruel Favors  Tyler Hensley is an 43 y.o. male.  HPI: Tyler Hensley developed some pain in his right foot starting on Sunday. He does not recall any antecedent event. He had a virtual visit with his PCP on Monday who thought it might be gout and put him on an antiinflammatory but it didn't help. His son noticed some drainage from it today and he came to the ED. He denies prior hx/o similar, N/V, fevers, chills, sweats. He is not diabetic but works on his feet all day.  History reviewed. No pertinent past medical history.  History reviewed. No pertinent surgical history.  History reviewed. No pertinent family history.  Social History:  reports that he has never smoked. He has never used smokeless tobacco. He reports that he does not drink alcohol or use drugs.  Allergies: No Known Allergies  Medications: I have reviewed the patient's current medications.  No results found for this or any previous visit (from the past 48 hour(s)).  Dg Foot 2 Views Right  Result Date: 09/07/2018 CLINICAL DATA:  Right foot pain EXAM: RIGHT FOOT - 2 VIEW COMPARISON:  None. FINDINGS: Negative for fracture or arthropathy. Short fourth metacarpal, appearing congenital. IMPRESSION: No acute abnormality.  Short fourth metacarpal. Electronically Signed   By: Marlan Palau M.D.   On: 09/07/2018 08:39    Review of Systems  Constitutional: Negative for chills, fever and weight loss.  HENT: Negative for ear discharge, ear pain, hearing loss and tinnitus.   Eyes: Negative for blurred vision, double vision, photophobia and pain.  Respiratory: Negative for cough, sputum production and shortness of breath.   Cardiovascular: Negative for chest pain.  Gastrointestinal: Negative for abdominal pain, nausea and vomiting.  Genitourinary: Negative for dysuria, flank pain, frequency and urgency.  Musculoskeletal: Positive for joint pain (Right foot). Negative for back  pain, falls, myalgias and neck pain.  Neurological: Negative for dizziness, tingling, sensory change, focal weakness, loss of consciousness and headaches.  Endo/Heme/Allergies: Does not bruise/bleed easily.  Psychiatric/Behavioral: Negative for depression, memory loss and substance abuse. The patient is not nervous/anxious.    Blood pressure 122/71, pulse 91, temperature 99 F (37.2 C), temperature source Oral, resp. rate 18, height 5\' 9"  (1.753 m), weight 131.5 kg, SpO2 98 %. Physical Exam  Constitutional: He appears well-developed and well-nourished. No distress.  HENT:  Head: Normocephalic and atraumatic.  Eyes: Conjunctivae are normal. Right eye exhibits no discharge. Left eye exhibits no discharge. No scleral icterus.  Neck: Normal range of motion.  Cardiovascular: Normal rate and regular rhythm.  Respiratory: Effort normal. No respiratory distress.  Musculoskeletal:     Comments: RLE No traumatic wounds, ecchymosis, or rash  Mod TTP base of 4th, 5th toes, mild erythema, mild edema midfoot, no streaking, mild odor, e/o tinea bilateral feet  No knee or ankle effusion  Knee stable to varus/ valgus and anterior/posterior stress  Sens DPN, SPN, TN intact  Motor EHL, ext, flex, evers 5/5  DP 2+, PT 1+  Neurological: He is alert.  Skin: Skin is warm and dry. He is not diaphoretic.  Psychiatric: He has a normal mood and affect. His behavior is normal.    Assessment/Plan: Right foot wound -- I suspect this is a secondary infection from his tinea. However, I have suggested an [place] to r/o abscess. If he has an abscess or if labs come back indicating systemic spread then I think IM admission with possible debridement tomorrow by Dr. Lajoyce Corners. If  not then OP abx treatment and f/u in office with Dr. Lajoyce Cornersuda is likely adequate.    Freeman CaldronMichael J. Gianno Volner, PA-C Orthopedic Surgery [phone] 09/07/2018, 3:57 PM

## 2018-09-07 NOTE — ED Notes (Signed)
Patient transported to Ultrasound 

## 2018-09-07 NOTE — ED Provider Notes (Addendum)
[facility] EMERGENCY DEPARTMENT Provider Note   CSN: [id number] Arrival date & time: 09/07/18  1450    History   Chief Complaint Chief Complaint  Patient presents with  . Wound Infection    HPI Tyler Hensley is a 43 y.o. male.     HPI Patient presents with pain and swelling in his right foot.  Began around 5 days ago.  Saw his PCP who states they thought it was gout given some anti-inflammatory medicine.  States continues to have pain.  Has had some drainage between his fourth and fifth toe.  Has a chronically shortened fourth toe.  States no fevers.  Has not had anything like this before.  Not diabetic.  No fevers. History reviewed. No pertinent past medical history.  There are no active problems to display for this patient.   History reviewed. No pertinent surgical history.      Home Medications    Prior to Admission medications   Medication Sig Start Date End Date Taking? Authorizing Provider  cephALEXin (KEFLEX) 500 MG capsule Take 1 capsule (500 mg total) by mouth 4 (four) times daily. 09/07/18   Benjiman Core, MD  diphenhydramine-acetaminophen (TYLENOL PM) 25-500 MG TABS tablet Take 1 tablet by mouth at bedtime as needed.    [provider]  esomeprazole (NEXIUM) 40 MG capsule Take 1 capsule (40 mg total) by mouth daily before breakfast. 09/23/11 09/22/12  Rodolph Bong, MD  fluticasone (FLONASE) 50 MCG/ACT nasal spray Place 2 sprays into the nose daily. 09/23/11 09/22/12  Rodolph Bong, MD  HYDROcodone-acetaminophen (NORCO/VICODIN) 5-325 MG tablet Take 1-2 tablets by mouth every 4 (four) hours as needed. 09/07/18   Benjiman Core, MD  HYDROcodone-homatropine (HYDROMET) 5-1.5 MG/5ML syrup Take 5 mLs by mouth every 6 (six) hours as needed for cough. Patient not taking: Reported on 12/14/2017 07/06/13   Elson Areas, PA-C  montelukast (SINGULAIR) 10 MG tablet Take 1 tablet (10 mg total) by mouth at bedtime. 12/14/17 01/13/18  Benay Pike, NP  OVER THE COUNTER MEDICATION dayquil    [provider]  oxymetazoline (AFRIN) 0.05 % nasal spray Place 1 spray into both nostrils 2 (two) times daily.    [provider]  pseudoephedrine (SUDAFED) 30 MG/5ML syrup Take by mouth 4 (four) times daily as needed for congestion.    [provider]  sulfamethoxazole-trimethoprim (BACTRIM DS) 800-160 MG tablet Take 1 tablet by mouth 2 (two) times daily for 7 days. 09/07/18 09/14/18  Benjiman Core, MD    Family History History reviewed. No pertinent family history.  Social History Social History   Tobacco Use  . Smoking status: Never Smoker  . Smokeless tobacco: Never Used  Substance Use Topics  . Alcohol use: No  . Drug use: No     Allergies   Patient has no known allergies.   Review of Systems Review of Systems  Constitutional: Negative for appetite change and fever.  Respiratory: Negative for shortness of breath.   Cardiovascular: Negative for chest pain.  Gastrointestinal: Negative for abdominal pain.  Musculoskeletal:       Right foot swelling.  Skin: Positive for wound.  Neurological: Negative for weakness.  Psychiatric/Behavioral: Negative for confusion.     Physical Exam Updated Vital Signs BP 115/70   Pulse 86   Temp 99 F (37.2 C) (Oral)   Resp 18   Ht 5\' 9"  (1.753 m)   Wt 131.5 kg   SpO2 97%   BMI 42.83 kg/m  Physical Exam Vitals signs and nursing note reviewed.  Eyes:     Pupils: Pupils are equal, round, and reactive to light.  Neck:     Musculoskeletal: Neck supple.  Musculoskeletal:     Comments: Some edema of right foot.  Between the short and fourth toe and the fifth toe there is some mild drainage.  There is pain with palpation on the foot in this area but not actual purulent drainage forming.  Skin:    Capillary Refill: Capillary refill takes less than 2 seconds.  Neurological:     Mental Status: He is alert.        ED Treatments / Results  Labs  (all labs ordered are listed, but only abnormal results are displayed) Labs Reviewed  BASIC METABOLIC PANEL - Abnormal; Notable for the following components:      Result Value   Glucose, Bld 117 (*)    All other components within normal limits  C-REACTIVE PROTEIN - Abnormal; Notable for the following components:   CRP 1.6 (*)    All other components within normal limits  AEROBIC CULTURE (SUPERFICIAL SPECIMEN)  CBC WITH DIFFERENTIAL/PLATELET  SEDIMENTATION RATE    EKG None  Radiology Dg Foot 2 Views Right  Result Date: 09/07/2018 CLINICAL DATA:  Right foot pain EXAM: RIGHT FOOT - 2 VIEW COMPARISON:  None. FINDINGS: Negative for fracture or arthropathy. Short fourth metacarpal, appearing congenital. IMPRESSION: No acute abnormality.  Short fourth metacarpal. Electronically Signed   By: Marlan Palauharles  Clark M.D.   On: 09/07/2018 08:39   [place] Rt Lower Extrem Ltd Soft Tissue Non Vascular  Result Date: 09/07/2018 CLINICAL DATA:  Right foot swelling EXAM: ULTRASOUND RIGHT FOURTH AND FIFTH TOE REGIONS TECHNIQUE: Longitudinal and transverse images were obtained between the fourth and fifth toes in an area of soft tissue swelling COMPARISON:  None. FINDINGS: There is soft tissue edema. There is no abnormal fluid or mass. No evident abscess. No focal ligamentous or tendon lesion is evident by ultrasound. IMPRESSION: Soft tissue edema. No evident abscess or abnormal fluid. No evident mass. Electronically Signed   By: Bretta BangWilliam  Woodruff III M.D.   On: 09/07/2018 16:22    Procedures Procedures (including critical care time)  Medications Ordered in ED Medications - No data to display   Initial Impression / Assessment and Plan / ED Course  I have reviewed the triage vital signs and the nursing notes.  Pertinent labs & imaging results that were available during my care of the patient were reviewed by me and considered in my medical decision making (see chart for details).        Patient with foot  infection.  Some drainage.  No clear abscess on ultrasound.  Normal white count but does have elevated sed rate.  Seen by orthopedic here.  Follow-up with Dr. Lajoyce Cornersuda.  Final Clinical Impressions(s) / ED Diagnoses   Final diagnoses:  Swelling  Cellulitis of right foot    ED Discharge Orders         Ordered    cephALEXin (KEFLEX) 500 MG capsule  4 times daily     09/07/18 1737    sulfamethoxazole-trimethoprim (BACTRIM DS) 800-160 MG tablet  2 times daily     09/07/18 1737    HYDROcodone-acetaminophen (NORCO/VICODIN) 5-325 MG tablet  Every 4 hours PRN     09/07/18 1737           Benjiman CorePickering, [place], MD 09/07/18 1738    Benjiman CorePickering, [place], MD 09/07/18 1739

## 2018-09-07 NOTE — ED Triage Notes (Signed)
Pt reports having right foot pain and swelling starting Sunday.  Pt called PCP on Monday and was placed on anti-inflammatory Monday afternoon.  Pt continued to have increase in pain and swelling and had an Xray on Thursday and was negative for fracture or dislocation.    Upon triage assessment it is noted that pt has purulent drainage and deep wound between 4th and 5th toes with a foul odor.   Pt denies hx of diabetes and any nuumbness and or tingling in lower extremities

## 2018-09-07 NOTE — Discharge Instructions (Addendum)
Watch for fevers or worsening redness and swelling.  Follow-up with Dr. Lajoyce Corners.

## 2018-09-09 LAB — AEROBIC CULTURE? (SUPERFICIAL SPECIMEN)

## 2018-09-09 LAB — AEROBIC CULTURE W GRAM STAIN (SUPERFICIAL SPECIMEN)

## 2018-09-10 ENCOUNTER — Telehealth: Payer: Self-pay | Admitting: Emergency Medicine

## 2018-09-10 NOTE — Telephone Encounter (Signed)
Post ED Visit - Positive Culture Follow-up  Culture report reviewed by antimicrobial stewardship pharmacist: [facility] []  Enzo Bi, Pharm.D. []  Celedonio Miyamoto, Pharm.D., BCPS AQ-ID []  Garvin Fila, Pharm.D., BCPS [x]  Georgina Pillion, [address].D., BCPS []  [place], [place].D., BCPS, AAHIVP []  Estella Husk, Pharm.D., BCPS, AAHIVP []  Lysle Pearl, PharmD, BCPS []  Phillips Climes, PharmD, BCPS []  Agapito Games, PharmD, BCPS []  Verlan Friends, PharmD []  Mervyn Gay, PharmD, BCPS []  Vinnie Level, PharmD  [facility] []  Len Childs, PharmD []  Greer Pickerel, PharmD []  Adalberto Cole, PharmD []  Perlie Gold, Rph []  Lonell Face) Jean Rosenthal, PharmD []  Earl Many, PharmD []  Junita Push, PharmD []  Dorna Leitz, PharmD []  Terrilee Files, PharmD []  Lynann Beaver, PharmD []  Keturah Barre, PharmD []  Loralee Pacas, PharmD []  Bernadene Person, PharmD   Positive aerobic culture Treated with Cephalexin and Sulfamethoxazole-Trimethoprim, organism sensitive to the same and no further patient follow-up is required at this time.  Carollee Herter Melvenia Favela 09/10/2018, 3:26 PM

## 2018-10-26 DIAGNOSIS — Z0000 Encounter for general adult medical examination without abnormal findings: Secondary | ICD-10-CM | POA: Diagnosis not present

## 2018-10-26 DIAGNOSIS — Z125 Encounter for screening for malignant neoplasm of prostate: Secondary | ICD-10-CM | POA: Diagnosis not present

## 2018-12-27 DIAGNOSIS — R062 Wheezing: Secondary | ICD-10-CM | POA: Diagnosis not present

## 2018-12-27 DIAGNOSIS — J988 Other specified respiratory disorders: Secondary | ICD-10-CM | POA: Diagnosis not present

## 2018-12-27 MED FILL — AZITHROMYCIN 250 MG TABS: 250 | 5 days supply | Qty: 6 | Fill #0

## 2018-12-27 MED FILL — ALBUTEROL SULFATE HFA 108 (: 108 (90 BAS | 25 days supply | Qty: 18 | Fill #0

## 2020-08-24 IMAGING — US RIGHT LOWER EXTREMITY SOFT TISSUE ULTRASOUND LIMITED
1 series · 14 of 25 positions shown · non-contrast
Comparison: None.

CLINICAL DATA: Right foot swelling

EXAM:
ULTRASOUND RIGHT FOURTH AND FIFTH TOE REGIONS
TECHNIQUE: Longitudinal and transverse images were obtained between the fourth
and fifth toes in an area of soft tissue swelling

[Series 1: right lower extremity soft tissue ultrasound limit · 28 acquisitions, 14 frames shown]
[im 1/28]
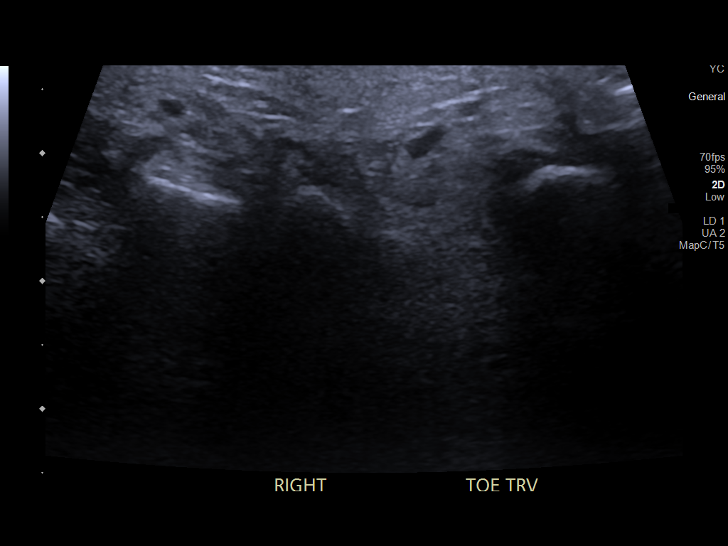
[im 3/28]
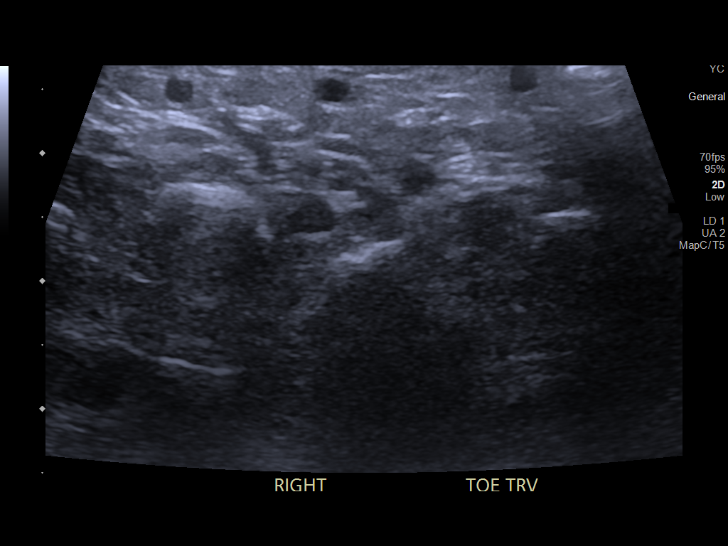
[im 5/28]
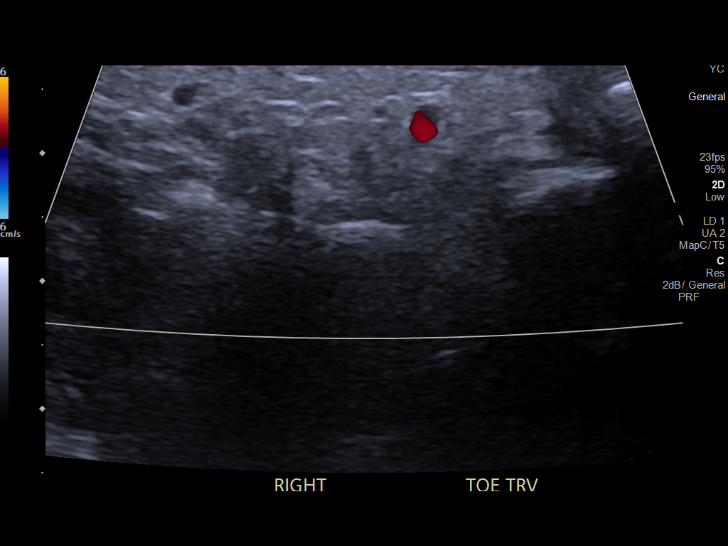
[im 7/28]
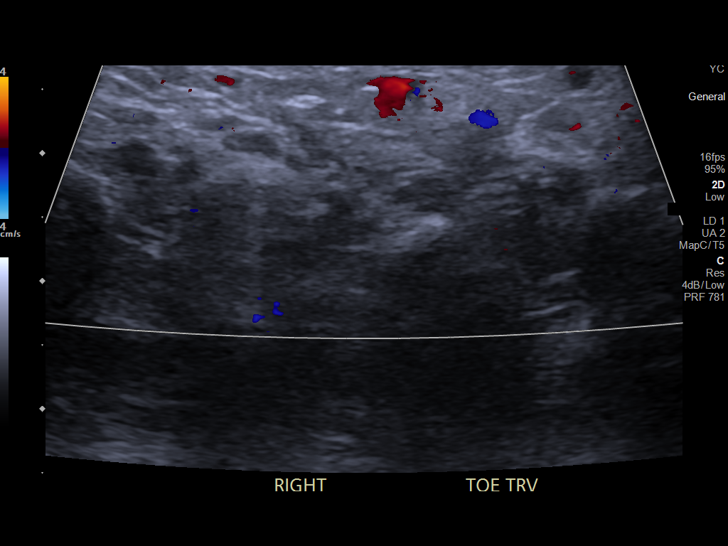
[im 10/28]
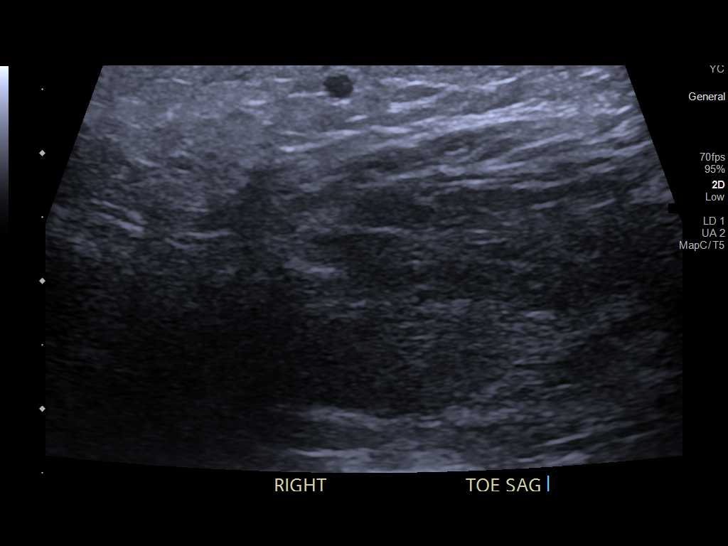
[im 11/28]
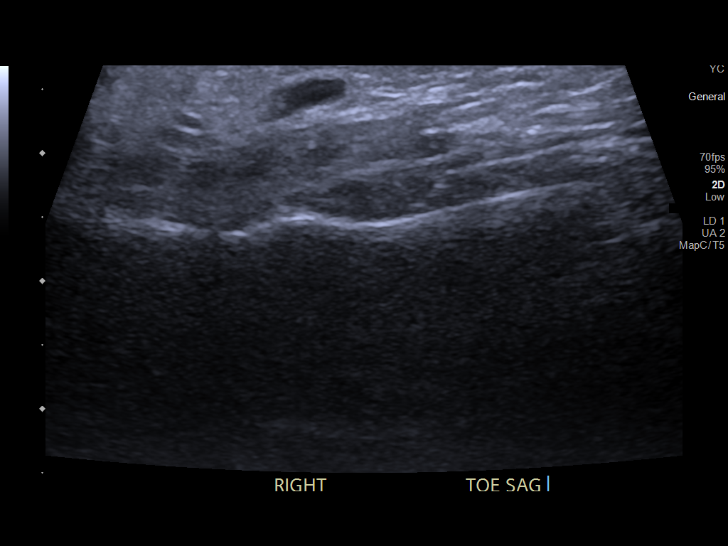
[im 13/28]
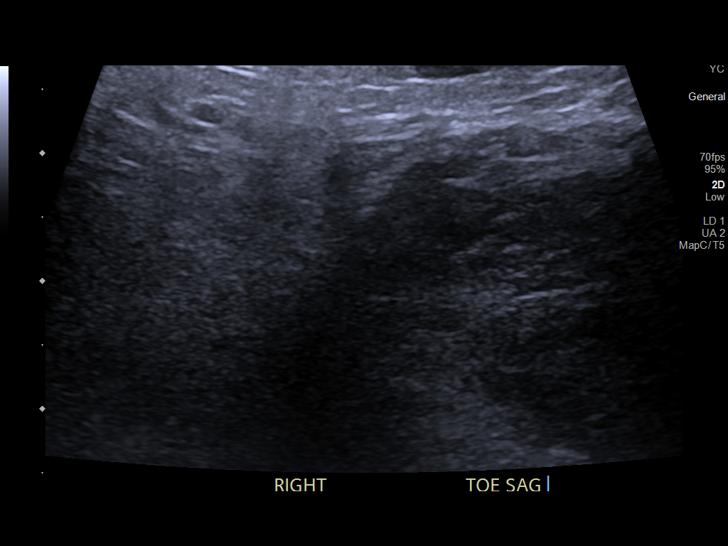
[im 15/28]
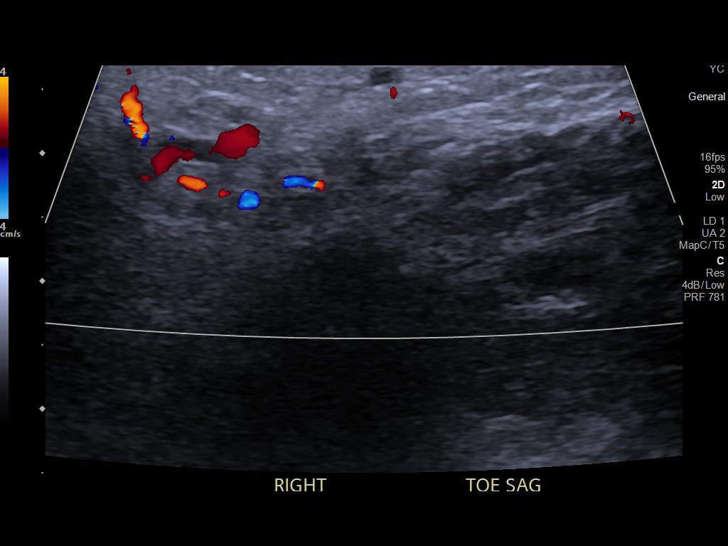
[im 17/28]
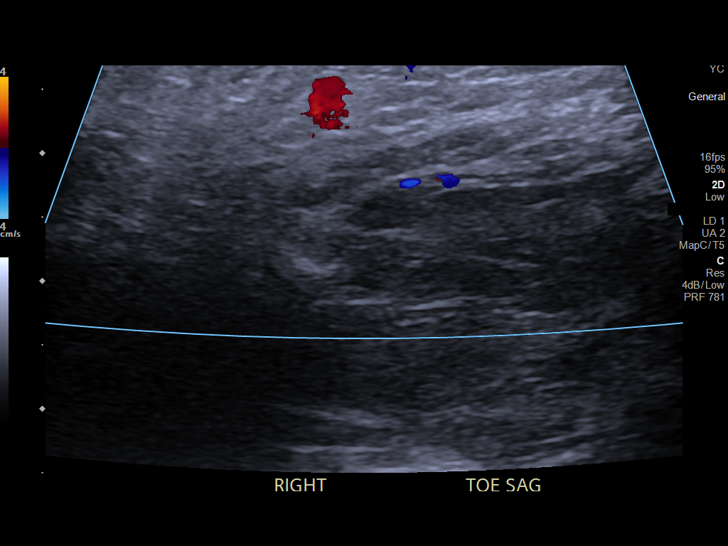
[im 19/28]
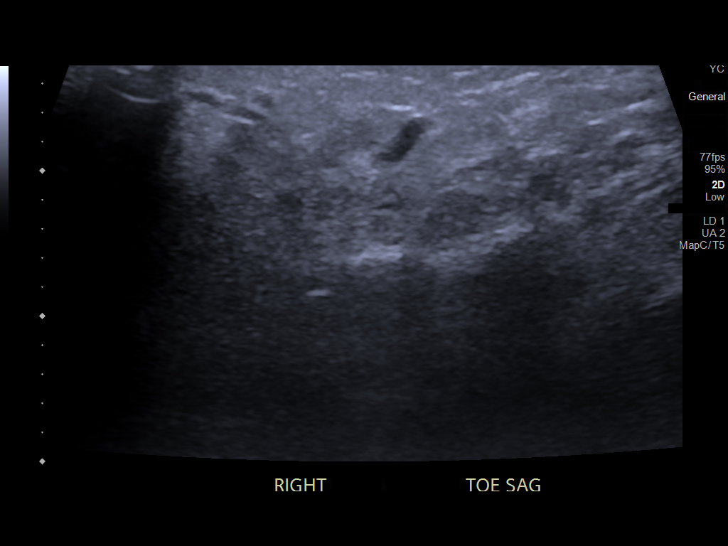
[im 21/28]
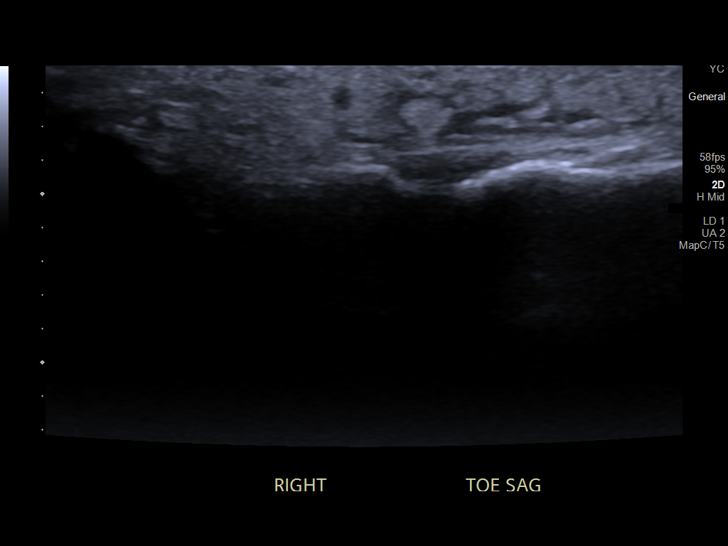
[im 23/28]
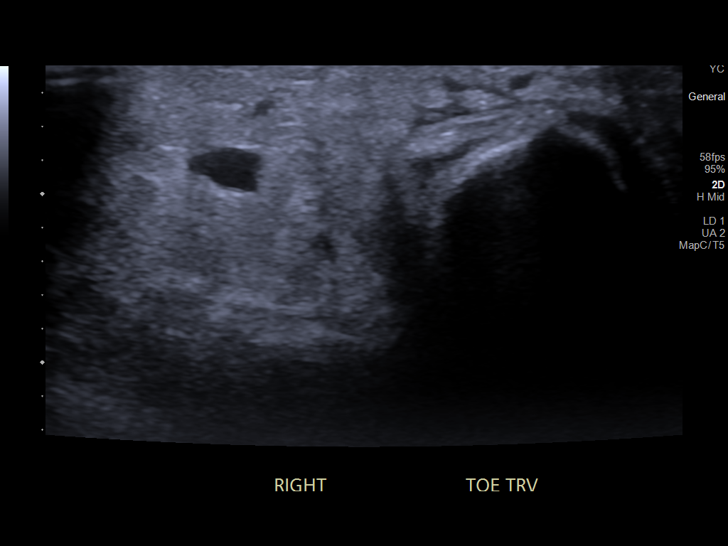
[im 25/28]
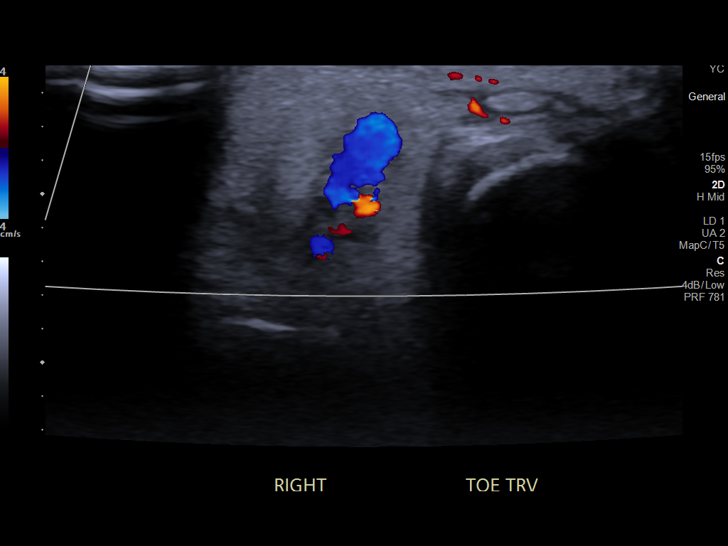
[im 28/28]
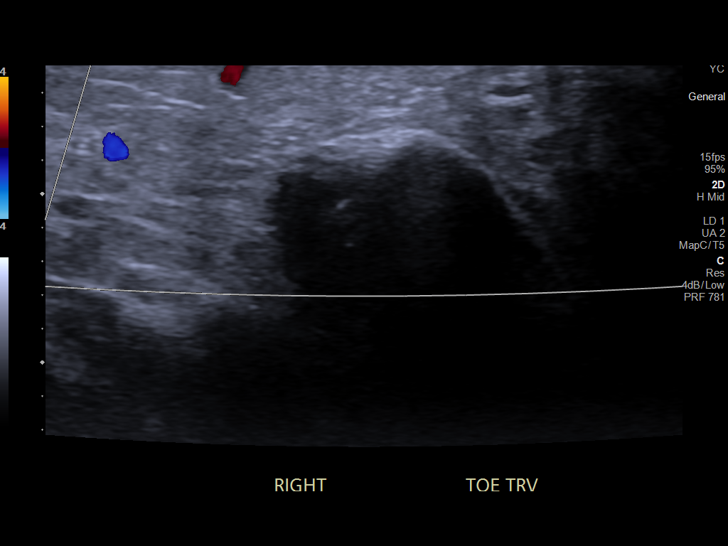

[14 of 25 positions shown; findings below may reference images not displayed]

FINDINGS: There is soft tissue edema. There is no abnormal fluid or mass. No
evident abscess. No focal ligamentous or tendon lesion is evident by
ultrasound.
IMPRESSION: Soft tissue edema. No evident abscess or abnormal fluid. No evident
mass.

## 2020-11-24 ENCOUNTER — Other Ambulatory Visit (HOSPITAL_COMMUNITY): Payer: Self-pay

## 2020-11-24 MED ORDER — ALBUTEROL SULFATE HFA 108 (90 BASE) MCG/ACT IN AERS
INHALATION_SPRAY | RESPIRATORY_TRACT | 1 refills | Status: AC
  Filled 2020-11-24 – 2021-06-01 (×2): qty 18, 25d supply, fill #0
  Filled 2021-11-04: qty 18, 25d supply, fill #1

## 2020-11-28 ENCOUNTER — Other Ambulatory Visit (HOSPITAL_COMMUNITY): Payer: Self-pay

## 2021-01-06 ENCOUNTER — Ambulatory Visit: Payer: 59 | Admitting: Registered"

## 2021-02-09 ENCOUNTER — Other Ambulatory Visit: Payer: Self-pay

## 2021-02-09 ENCOUNTER — Other Ambulatory Visit (HOSPITAL_COMMUNITY): Payer: Self-pay

## 2021-02-09 ENCOUNTER — Emergency Department
Admission: RE | Admit: 2021-02-09 | Discharge: 2021-02-09 | Disposition: A | Discharge status: Home / Self Care | Payer: Self-pay | Source: Ambulatory Visit | Attending: Family Medicine | Admitting: Family Medicine

## 2021-02-09 VITALS — BP 152/101 | HR 100 | Temp 98.8°F | Resp 14 | Wt 281.2 lb

## 2021-02-09 DIAGNOSIS — I10 Essential (primary) hypertension: Secondary | ICD-10-CM

## 2021-02-09 LAB — POCT URINALYSIS DIP (MANUAL ENTRY)
Bilirubin, UA: NEGATIVE
Blood, UA: NEGATIVE
Glucose, UA: NEGATIVE mg/dL
Ketones, POC UA: NEGATIVE mg/dL
Leukocytes, UA: NEGATIVE
Nitrite, UA: NEGATIVE
Protein Ur, POC: NEGATIVE mg/dL
Spec Grav, UA: 1.025 (ref 1.010–1.025)
Urobilinogen, UA: 0.2 E.U./dL
pH, UA: 7 (ref 5.0–8.0)

## 2021-02-09 MED ORDER — DILTIAZEM HCL ER 120 MG PO CP24
120.0000 mg | ORAL_CAPSULE | Freq: Every day | ORAL | 0 refills | Status: DC
  Filled 2021-02-09 (×2): qty 30, 30d supply, fill #0

## 2021-02-09 NOTE — ED Provider Notes (Signed)
[facility]    CSN: [id number] Arrival date & time: 02/09/21  0947      History   Chief Complaint Chief Complaint  Patient presents with   Dizziness   Hypertension    HPI Tyler Hensley is a 45 y.o. male.   Three days ago patient experienced an episode of dizziness, headache, and transient vision changes.  He measured his BP at 150/105.  Two days ago he measured his BP at 150/105.  Yesterday his BP was 147/102 and today 147/108.  His headache and other symptoms have resolved. He has a family history of hypertension in multiple relatives.  He states that he has been diagnosed with pre-diabetes in the past with Hgb A1c or 6+%.    The history is provided by the patient.   History reviewed. No pertinent past medical history.  Problems:  Obesity and OSA   History reviewed. No pertinent surgical history.     Home Medications    Prior to Admission medications   Medication Sig Start Date End Date Taking? Authorizing Provider  diltiazem (DILACOR XR) 120 MG 24 hr capsule Take 1 capsule (120 mg total) by mouth daily. 02/09/21  Yes Lattie Haw, MD  albuterol (VENTOLIN HFA) 108 (90 Base) MCG/ACT inhaler Inhale 1 to 2 puffs by mouth into the lungs every 6 hours as needed for wheezing 11/24/20     cephALEXin (KEFLEX) 500 MG capsule Take 1 capsule (500 mg total) by mouth 4 (four) times daily. Patient not taking: Reported on 02/09/2021 09/07/18   Benjiman Core, MD  diphenhydramine-acetaminophen (TYLENOL PM) 25-500 MG TABS tablet Take 1 tablet by mouth at bedtime as needed.    [provider]  esomeprazole (NEXIUM) 40 MG capsule Take 1 capsule (40 mg total) by mouth daily before breakfast. 09/23/11 09/22/12  Rodolph Bong, MD  fluticasone (FLONASE) 50 MCG/ACT nasal spray Place 2 sprays into the nose daily. 09/23/11 09/22/12  Rodolph Bong, MD  HYDROcodone-acetaminophen (NORCO/VICODIN) 5-325 MG tablet Take 1-2 tablets by mouth every 4 (four) hours as  needed. Patient not taking: Reported on 02/09/2021 09/07/18   Benjiman Core, MD  HYDROcodone-homatropine (HYDROMET) 5-1.5 MG/5ML syrup Take 5 mLs by mouth every 6 (six) hours as needed for cough. Patient not taking: Reported on 12/14/2017 07/06/13   Elson Areas, PA-C  montelukast (SINGULAIR) 10 MG tablet Take 1 tablet (10 mg total) by mouth at bedtime. 12/14/17 01/13/18  Benay Pike, NP  OVER THE COUNTER MEDICATION dayquil    [provider]  oxymetazoline (AFRIN) 0.05 % nasal spray Place 1 spray into both nostrils 2 (two) times daily.    [provider]  pseudoephedrine (SUDAFED) 30 MG/5ML syrup Take by mouth 4 (four) times daily as needed for congestion.    [provider]    Family History History reviewed:  hypertension   Social History Social History   Tobacco Use   Smoking status: Never   Smokeless tobacco: Never  Vaping Use   Vaping Use: Never used  Substance Use Topics   Alcohol use: No   Drug use: No     Allergies   Patient has no known allergies.   Review of Systems Review of Systems  Constitutional:  Positive for activity change. Negative for appetite change, chills, diaphoresis, fatigue and fever.  HENT: Negative.    Eyes:  Positive for visual disturbance.  Respiratory:  Negative for cough, chest tightness, shortness of breath and wheezing.   Cardiovascular:  Negative for chest  pain, palpitations and leg swelling.  Gastrointestinal: Negative.   Genitourinary: Negative.   Musculoskeletal: Negative.   Skin: Negative.   Neurological:  Positive for dizziness, light-headedness and headaches. Negative for tremors, syncope, facial asymmetry, speech difficulty, weakness and numbness.  Hematological:  Negative for adenopathy.    Physical Exam Triage Vital Signs ED Triage Vitals  Enc Vitals Group     BP 02/09/21 1008 (!) 142/109     Pulse Rate 02/09/21 1008 100     Resp 02/09/21 1008 14     Temp 02/09/21 1008 98.8 F  (37.1 C)     Temp Source 02/09/21 1008 Oral     SpO2 02/09/21 1008 98 %     Weight 02/09/21 1046 281 lb 3.2 oz (127.6 kg)     Height --      Head Circumference --      Peak Flow --      Pain Score 02/09/21 1010 0     Pain Loc --      Pain Edu? --      Excl. in [place]? --    No data found.  Updated Vital Signs BP (!) 152/101 (BP Location: Right Arm)   Pulse 100   Temp 98.8 F (37.1 C) (Oral)   Resp 14   Wt 127.6 kg   SpO2 98%   BMI 41.53 kg/m   Visual Acuity Right Eye Distance:   Left Eye Distance:   Bilateral Distance:    Right Eye Near:   Left Eye Near:    Bilateral Near:     Physical Exam Nursing notes and Vital Signs reviewed. Appearance:  Patient appears stated age, and in no acute distress.    Eyes:  Pupils are equal, round, and reactive to light and accomodation.  Extraocular movement is intact.  Conjunctivae are not inflamed.  Fundi benign.   Pharynx:  Normal; moist mucous membranes  Neck:  Supple.  No adenopathy or thyromegaly.  Carotids have normal upstrokes without bruits. Lungs:  Clear to auscultation.  Breath sounds are equal.  Moving air well. Heart:  Regular rate and rhythm without murmurs, rubs, or gallops.  Abdomen:  Nontender without masses or hepatosplenomegaly.  Bowel sounds are present.  No CVA or flank tenderness.  Extremities:  No edema.  Skin:  No rash present.     UC Treatments / Results  Labs (all labs ordered are listed, but only abnormal results are displayed) Labs Reviewed  CBC WITH DIFFERENTIAL/PLATELET  COMPLETE METABOLIC PANEL WITH GFR  POCT URINALYSIS DIP (MANUAL ENTRY) Component Ref Range & Units 2 d ago   Color, UA yellow yellow   Clarity, UA clear clear   Glucose, UA negative mg/dL negative   Bilirubin, UA negative negative   Ketones, POC UA negative mg/dL negative   Spec Grav, UA 1.010 - 1.025 1.025   Blood, UA negative negative   pH, UA 5.0 - 8.0 7.0   Protein Ur, POC negative mg/dL negative   Urobilinogen, UA 0.2 or 1.0  E.U./dL 0.2   Nitrite, UA Negative Negative   Leukocytes, UA Negative Negative       EKG  Rate:  81 BPM PR:  148 msec QT:  336 msec QTcH:  390 msec QRSD:  78 msec QRS axis:  72 degrees Interpretation:  Non-specific ST and T wave changes; normal sinus rhythm; no acute changes.  Radiology No results found.  Procedures Procedures (including critical care time)  Medications Ordered in UC Medications - No data to display  Initial Impression / Assessment and Plan / UC Course  I have reviewed the triage vital signs and the nursing notes.  Pertinent labs & imaging results that were available during my care of the patient were reviewed by me and considered in my medical decision making (see chart for details). Past medical records reviewed.    Note unremarkable EKG and normal urinalysis.  CMP and CBC pending Begin diltiazem XR 120mg  once daily.  Followup with Family Doctor if not improved in about 2 weeks.  Final Clinical Impressions(s) / UC Diagnoses   Final diagnoses:  Essential hypertension     Discharge Instructions      Please monitor your blood pressure several times weekly, at different times of the day, record on a calendar with times of measurement and take this record to your Family Doctor.   If symptoms become significantly worse during the night or over the weekend, proceed to the local emergency room.      ED Prescriptions     Medication Sig Dispense Auth. Provider   diltiazem (DILACOR XR) 120 MG 24 hr capsule Take 1 capsule (120 mg total) by mouth daily. 30 capsule , MD         Lattie Haw, MD 02/11/21 [phone]

## 2021-02-09 NOTE — Discharge Instructions (Signed)
Please monitor your blood pressure several times weekly, at different times of the day, record on a calendar with times of measurement and take this record to your Family Doctor.  If symptoms become significantly worse during the night or over the weekend, proceed to the local emergency room.  

## 2021-02-09 NOTE — ED Triage Notes (Signed)
Pt presents with dizziness, HA,  and high BP that began Friday. Pt states he has had increased stress with work. Pt states Friday he was driving and felt as though he was going to pass out. He pulled over and contacted his wife. Pt states he has been drinking water and monitoring his BP throughout the weekend. Pt states he is also prediabetic.

## 2021-02-10 LAB — COMPLETE METABOLIC PANEL WITH GFR
AG Ratio: 1.6 (calc) (ref 1.0–2.5)
ALT: 25 U/L (ref 9–46)
AST: 20 U/L (ref 10–40)
Albumin: 4.3 g/dL (ref 3.6–5.1)
Alkaline phosphatase (APISO): 56 U/L (ref 36–130)
BUN: 16 mg/dL (ref 7–25)
CO2: 25 mmol/L (ref 20–32)
Calcium: 9.9 mg/dL (ref 8.6–10.3)
Chloride: 105 mmol/L (ref 98–110)
Creat: 0.98 mg/dL (ref 0.60–1.29)
Globulin: 2.7 g/dL (calc) (ref 1.9–3.7)
Glucose, Bld: 104 mg/dL — ABNORMAL HIGH (ref 65–99)
Potassium: 4.3 mmol/L (ref 3.5–5.3)
Sodium: 140 mmol/L (ref 135–146)
Total Bilirubin: 0.2 mg/dL (ref 0.2–1.2)
Total Protein: 7 g/dL (ref 6.1–8.1)
eGFR: 98 mL/min/{1.73_m2} (ref >=60)

## 2021-02-10 LAB — CBC WITH DIFFERENTIAL/PLATELET
Absolute Monocytes: 541 cells/uL (ref 200–950)
Basophils Absolute: 73 cells/uL (ref 0–200)
Basophils Relative: 1.1 %
Eosinophils Absolute: 251 cells/uL (ref 15–500)
Eosinophils Relative: 3.8 %
HCT: 45.8 % (ref 38.5–50.0)
Hemoglobin: 14.9 g/dL (ref 13.2–17.1)
Lymphs Abs: 1841 cells/uL (ref 850–3900)
MCH: 27.6 pg (ref 27.0–33.0)
MCHC: 32.5 g/dL (ref 32.0–36.0)
MCV: 84.8 fL (ref 80.0–100.0)
MPV: 10.9 fL (ref 7.5–12.5)
Monocytes Relative: 8.2 %
Neutro Abs: 3894 cells/uL (ref 1500–7800)
Neutrophils Relative %: 59 %
Platelets: 318 10*3/uL (ref 140–400)
RBC: 5.4 10*6/uL (ref 4.20–5.80)
RDW: 12.6 % (ref 11.0–15.0)
Total Lymphocyte: 27.9 %
WBC: 6.6 10*3/uL (ref 3.8–10.8)

## 2021-02-26 ENCOUNTER — Other Ambulatory Visit (HOSPITAL_COMMUNITY): Payer: Self-pay

## 2021-02-26 MED ORDER — DILTIAZEM HCL ER COATED BEADS 120 MG PO CP24
ORAL_CAPSULE | ORAL | 3 refills | Status: DC
  Filled 2021-02-26: qty 90, 90d supply, fill #0
  Filled 2021-08-10: qty 90, 90d supply, fill #1

## 2021-06-01 ENCOUNTER — Other Ambulatory Visit (HOSPITAL_COMMUNITY): Payer: Self-pay

## 2021-08-10 ENCOUNTER — Other Ambulatory Visit (HOSPITAL_COMMUNITY): Payer: Self-pay

## 2021-09-24 ENCOUNTER — Other Ambulatory Visit (HOSPITAL_BASED_OUTPATIENT_CLINIC_OR_DEPARTMENT_OTHER): Payer: Self-pay

## 2021-09-24 ENCOUNTER — Ambulatory Visit: Payer: No Typology Code available for payment source | Attending: Internal Medicine

## 2021-09-24 DIAGNOSIS — Z23 Encounter for immunization: Secondary | ICD-10-CM

## 2021-09-24 MED ORDER — PFIZER COVID-19 VAC BIVALENT 30 MCG/0.3ML IM SUSP
INTRAMUSCULAR | 0 refills | Status: DC
  Filled 2021-09-24: qty 0.3, 1d supply, fill #0

## 2021-09-24 NOTE — Progress Notes (Signed)
   Covid-19 Vaccination Clinic  Name:  Tyler Hensley    MRN: [id number] DOB: Dec 18, 1975  09/24/2021  Tyler Hensley was observed post Covid-19 immunization for 15 minutes without incident. He was provided with Vaccine Information Sheet and instruction to access the V-Safe system.   Tyler Hensley was instructed to call 911 with any severe reactions post vaccine: Difficulty breathing  Swelling of face and throat  A fast heartbeat  A bad rash all over body  Dizziness and weakness   Immunizations Administered     Name Date Dose VIS Date Route   Pfizer Covid-19 Vaccine Bivalent Booster 09/24/2021  1:00 PM 0.3 mL 12/17/2020 Intramuscular   Manufacturer: [place]   Lot: [id number]   [place]: [phone]

## 2021-09-25 ENCOUNTER — Other Ambulatory Visit (HOSPITAL_BASED_OUTPATIENT_CLINIC_OR_DEPARTMENT_OTHER): Payer: Self-pay

## 2021-11-03 ENCOUNTER — Other Ambulatory Visit: Payer: Self-pay

## 2021-11-04 ENCOUNTER — Other Ambulatory Visit (HOSPITAL_COMMUNITY): Payer: Self-pay

## 2021-11-04 MED ORDER — DILTIAZEM HCL ER 120 MG PO CP24
120.0000 mg | ORAL_CAPSULE | Freq: Every day | ORAL | 0 refills | Status: DC
  Filled 2021-11-04: qty 90, 90d supply, fill #0

## 2021-11-05 ENCOUNTER — Other Ambulatory Visit (HOSPITAL_COMMUNITY): Payer: Self-pay

## 2021-11-06 ENCOUNTER — Other Ambulatory Visit (HOSPITAL_COMMUNITY): Payer: Self-pay

## 2022-01-28 ENCOUNTER — Other Ambulatory Visit (HOSPITAL_COMMUNITY): Payer: Self-pay

## 2022-01-28 MED ORDER — DILTIAZEM HCL ER 120 MG PO CP24
120.0000 mg | ORAL_CAPSULE | Freq: Every day | ORAL | 0 refills | Status: DC
  Filled 2022-01-28: qty 90, 90d supply, fill #0

## 2022-01-29 ENCOUNTER — Other Ambulatory Visit (HOSPITAL_COMMUNITY): Payer: Self-pay

## 2022-04-28 ENCOUNTER — Other Ambulatory Visit (HOSPITAL_COMMUNITY): Payer: Self-pay

## 2022-04-28 MED ORDER — DILTIAZEM HCL ER 120 MG PO CP24
120.0000 mg | ORAL_CAPSULE | Freq: Every day | ORAL | 2 refills | Status: DC
  Filled 2022-04-28: qty 90, 90d supply, fill #0
  Filled 2022-07-28: qty 90, 90d supply, fill #1
  Filled 2022-10-25: qty 90, 90d supply, fill #2

## 2022-04-29 ENCOUNTER — Other Ambulatory Visit (HOSPITAL_COMMUNITY): Payer: Self-pay

## 2022-05-03 ENCOUNTER — Other Ambulatory Visit (HOSPITAL_COMMUNITY): Payer: Self-pay

## 2022-06-05 ENCOUNTER — Other Ambulatory Visit: Payer: Self-pay

## 2022-06-05 ENCOUNTER — Ambulatory Visit
Admission: RE | Admit: 2022-06-05 | Discharge: 2022-06-05 | Disposition: A | Discharge status: Home / Self Care | Payer: 59 | Source: Ambulatory Visit | Attending: Family Medicine | Admitting: Family Medicine

## 2022-06-05 VITALS — BP 158/83 | HR 93 | Temp 98.9°F | Resp 18

## 2022-06-05 DIAGNOSIS — N451 Epididymitis: Secondary | ICD-10-CM

## 2022-06-05 LAB — POCT URINALYSIS DIP (MANUAL ENTRY)
Bilirubin, UA: NEGATIVE
Blood, UA: NEGATIVE
Glucose, UA: NEGATIVE mg/dL
Ketones, POC UA: NEGATIVE mg/dL
Leukocytes, UA: NEGATIVE
Nitrite, UA: NEGATIVE
Protein Ur, POC: NEGATIVE mg/dL
Spec Grav, UA: 1.025 (ref 1.010–1.025)
Urobilinogen, UA: 0.2 E.U./dL
pH, UA: 5.5 (ref 5.0–8.0)

## 2022-06-05 MED ORDER — LEVOFLOXACIN 500 MG PO TABS: 500.0000 mg | ORAL_TABLET | Freq: Every day | ORAL | 0 refills | Status: AC

## 2022-06-05 NOTE — ED Triage Notes (Signed)
Pt is here with groin pain and right testicle pain/lower back pain since Sunday, pt has taken OTC meds to relieve discomfort.

## 2022-06-05 NOTE — ED Provider Notes (Signed)
[facility]    CSN: [id number] Arrival date & time: 06/05/22  [id number]      History   Chief Complaint Chief Complaint  Patient presents with   Groin Pain    Groin feeling tender, testicles aching.lower back pain - Entered by patient    HPI Tyler Hensley is a 47 y.o. male.   HPI  Patient has had 6 days of symptoms.  Slowly worsening.  Pain in his right testicle.  Mild suprapubic fullness.  Uncomfortable.  Some pain that comes and goes across his low back.  No fever or chills.  No nausea or vomiting.  No history of kidney stones or kidney problems.  Patient is married and declines STD testing.  He has not had trouble with prostate or urinary tract infections in the past.  History reviewed. No pertinent past medical history.  There are no problems to display for this patient.   History reviewed. No pertinent surgical history.     Home Medications    Prior to Admission medications   Medication Sig Start Date End Date Taking? Authorizing Provider  levofloxacin (LEVAQUIN) 500 MG tablet Take 1 tablet (500 mg total) by mouth daily. 06/05/22  Yes Raylene Everts, MD  albuterol (VENTOLIN HFA) 108 (90 Base) MCG/ACT inhaler Inhale 1 to 2 puffs by mouth into the lungs every 6 hours as needed for wheezing 11/24/20     diltiazem (DILT-XR) 120 MG 24 hr capsule Take 1 capsule (120 mg total) by mouth daily. 04/28/22     diphenhydramine-acetaminophen (TYLENOL PM) 25-500 MG TABS tablet Take 1 tablet by mouth at bedtime as needed.    [provider]  esomeprazole (NEXIUM) 40 MG capsule Take 1 capsule (40 mg total) by mouth daily before breakfast. 09/23/11 09/22/12  Gregor Hams, MD  OVER THE COUNTER MEDICATION dayquil    [provider]  oxymetazoline (AFRIN) 0.05 % nasal spray Place 1 spray into both nostrils 2 (two) times daily.    [provider]  pseudoephedrine (SUDAFED) 30 MG/5ML syrup Take by mouth 4 (four) times daily as needed for congestion.     [provider]    Family History Family History  Problem Relation Age of Onset   Hypertension Mother    Diabetes Mother    HIV/AIDS Father     Social History Social History   Tobacco Use   Smoking status: Never   Smokeless tobacco: Never  Vaping Use   Vaping Use: Never used  Substance Use Topics   Alcohol use: No   Drug use: No     Allergies   Patient has no known allergies.   Review of Systems Review of Systems See HPI  Physical Exam Triage Vital Signs ED Triage Vitals  Enc Vitals Group     BP 06/05/22 1012 (!) 158/83     Pulse Rate 06/05/22 1012 93     Resp 06/05/22 1012 18     Temp 06/05/22 1012 98.9 F (37.2 C)     Temp Source 06/05/22 1012 Oral     SpO2 06/05/22 1012 99 %     Weight --      Height --      Head Circumference --      Peak Flow --      Pain Score 06/05/22 1008 3     Pain Loc --      Pain Edu? --      Excl. in [place]? --    No data found.  Updated Vital Signs BP (!) 158/83 (BP Location: Left Arm)   Pulse 93   Temp 98.9 F (37.2 C) (Oral)   Resp 18   SpO2 99%     Physical Exam Constitutional:      General: He is not in acute distress.    Appearance: He is well-developed.  HENT:     Head: Normocephalic and atraumatic.  Eyes:     Conjunctiva/sclera: Conjunctivae normal.     Pupils: Pupils are equal, round, and reactive to light.  Cardiovascular:     Rate and Rhythm: Normal rate.  Pulmonary:     Effort: Pulmonary effort is normal. No respiratory distress.  Abdominal:     General: There is no distension.     Palpations: Abdomen is soft.     Tenderness: There is no right CVA tenderness or left CVA tenderness.  Genitourinary:    Penis: Normal.      Comments: Abdomen is benign.  Normal circumcised penis.  There is tenderness at the superior pole of the right testicle with fullness in the epididymis and collecting tubules.  Very tender.  No hernia Musculoskeletal:        General: Normal range of motion.     Cervical  back: Normal range of motion.  Skin:    General: Skin is warm and dry.  Neurological:     Mental Status: He is alert.      UC Treatments / Results  Labs (all labs ordered are listed, but only abnormal results are displayed) Labs Reviewed  POCT URINALYSIS DIP (MANUAL ENTRY)    EKG   Radiology No results found.  Procedures Procedures (including critical care time)  Medications Ordered in UC Medications - No data to display  Initial Impression / Assessment and Plan / UC Course  I have reviewed the triage vital signs and the nursing notes.  Pertinent labs & imaging results that were available during my care of the patient were reviewed by me and considered in my medical decision making (see chart for details).     Final Clinical Impressions(s) / UC Diagnoses   Final diagnoses:  Epididymitis, right     Discharge Instructions      The antibiotic Levaquin once a day for 10 days Drink lots of water Take ibuprofen as needed for discomfort See your doctor if not improving by next week     ED Prescriptions     Medication Sig Dispense Auth. Provider   levofloxacin (LEVAQUIN) 500 MG tablet Take 1 tablet (500 mg total) by mouth daily. 10 tablet Raylene Everts, MD      PDMP not reviewed this encounter.   Raylene Everts, MD 06/05/22 1049

## 2022-06-05 NOTE — Discharge Instructions (Addendum)
The antibiotic Levaquin once a day for 10 days Drink lots of water Take ibuprofen as needed for discomfort See your doctor if not improving by next week

## 2022-06-06 ENCOUNTER — Telehealth: Payer: Self-pay | Admitting: Emergency Medicine

## 2022-06-06 NOTE — Telephone Encounter (Signed)
[place].  Advised if doing well to disregard the call.  Any questions or concerns, feel free to contact the office.

## 2022-07-09 ENCOUNTER — Other Ambulatory Visit (HOSPITAL_COMMUNITY): Payer: Self-pay

## 2022-07-09 DIAGNOSIS — N50811 Right testicular pain: Secondary | ICD-10-CM | POA: Diagnosis not present

## 2022-07-09 MED ORDER — SULFAMETHOXAZOLE-TRIMETHOPRIM 800-160 MG PO TABS
1.0000 | ORAL_TABLET | Freq: Two times a day (BID) | ORAL | 0 refills | Status: AC
  Filled 2022-07-09: qty 20, 10d supply, fill #0

## 2022-07-28 ENCOUNTER — Other Ambulatory Visit: Payer: Self-pay

## 2022-09-01 ENCOUNTER — Telehealth: Payer: 59 | Admitting: Physician Assistant

## 2022-09-01 DIAGNOSIS — H8110 Benign paroxysmal vertigo, unspecified ear: Secondary | ICD-10-CM

## 2022-09-01 MED ORDER — MECLIZINE HCL 25 MG PO TABS: 25.0000 mg | ORAL_TABLET | Freq: Three times a day (TID) | ORAL | 0 refills | Status: AC | PRN

## 2022-09-01 NOTE — Patient Instructions (Signed)
Jeneen Rinks, thank you for joining Margaretann Loveless, PA-C for today's virtual visit.  While this provider is not your primary care provider (PCP), if your PCP is located in our provider database this encounter information will be shared with them immediately following your visit.   A [facility] MyChart account gives you access to today's visit and all your visits, tests, and labs performed at [facility] " click here if you don't have a [facility] MyChart account or go to [email or web address]  Consent: (Patient) Tyler Hensley provided verbal consent for this virtual visit at the beginning of the encounter.  Current Medications:  Current Outpatient Medications:    meclizine (ANTIVERT) 25 MG tablet, Take 1 tablet (25 mg total) by mouth 3 (three) times daily as needed for dizziness., Disp: 30 tablet, Rfl: 0   albuterol (VENTOLIN HFA) 108 (90 Base) MCG/ACT inhaler, Inhale 1 to 2 puffs by mouth into the lungs every 6 hours as needed for wheezing, Disp: 18 g, Rfl: 1   diltiazem (DILT-XR) 120 MG 24 hr capsule, Take 1 capsule (120 mg total) by mouth daily., Disp: 90 capsule, Rfl: 2   diphenhydramine-acetaminophen (TYLENOL PM) 25-500 MG TABS tablet, Take 1 tablet by mouth at bedtime as needed., Disp: , Rfl:    esomeprazole (NEXIUM) 40 MG capsule, Take 1 capsule (40 mg total) by mouth daily before breakfast., Disp: 30 capsule, Rfl: 3   levofloxacin (LEVAQUIN) 500 MG tablet, Take 1 tablet (500 mg total) by mouth daily., Disp: 10 tablet, Rfl: 0   OVER THE COUNTER MEDICATION, dayquil, Disp: , Rfl:    oxymetazoline (AFRIN) 0.05 % nasal spray, Place 1 spray into both nostrils 2 (two) times daily., Disp: , Rfl:    pseudoephedrine (SUDAFED) 30 MG/5ML syrup, Take by mouth 4 (four) times daily as needed for congestion., Disp: , Rfl:    Medications ordered in this encounter:  Meds ordered this encounter  Medications   meclizine (ANTIVERT) 25 MG tablet    Sig: Take 1 tablet (25  mg total) by mouth 3 (three) times daily as needed for dizziness.    Dispense:  30 tablet    Refill:  0    Order Specific Question:   Supervising Provider    Answer:   Merrilee Jansky [id number]     *If you need refills on other medications prior to your next appointment, please contact your pharmacy*  Follow-Up: Call back or seek an in-person evaluation if the symptoms worsen or if the condition fails to improve as anticipated.  [facility] [phone]  Other Instructions How to Perform the Epley Maneuver The Epley maneuver is an exercise that relieves symptoms of vertigo. Vertigo is the feeling that you or your surroundings are moving when they are not. When you feel vertigo, you may feel like the room is spinning and may have trouble walking. The Epley maneuver is used for a type of vertigo caused by a calcium deposit in a part of the inner ear. The maneuver involves changing head positions to help the deposit move out of the area. You can do this maneuver at home whenever you have symptoms of vertigo. You can repeat it in 24 hours if your vertigo has not gone away. Even though the Epley maneuver may relieve your vertigo for a few weeks, it is possible that your symptoms will return. This maneuver relieves vertigo, but it does not relieve dizziness. What are the risks? If it is done correctly, the Epley  maneuver is considered safe. Sometimes it can lead to dizziness or nausea that goes away after a short time. If you develop other symptoms--such as changes in vision, weakness, or numbness--stop doing the maneuver and call your health care provider. Supplies needed: A bed or table. A pillow. How to do the Epley maneuver     Sit on the edge of a bed or table with your back straight and your legs extended or hanging over the edge of the bed or table. Turn your head halfway toward the affected ear or side as told by your health care provider. Lie backward quickly with  your head turned until you are lying flat on your back. Your head should dangle (head-hanging position). You may want to position a pillow under your shoulders. Hold this position for at least 30 seconds. If you feel dizzy or have symptoms of vertigo, continue to hold the position until the symptoms stop. Turn your head to the opposite direction until your unaffected ear is facing down. Your head should continue to dangle. Hold this position for at least 30 seconds. If you feel dizzy or have symptoms of vertigo, continue to hold the position until the symptoms stop. Turn your whole body to the same side as your head so that you are positioned on your side. Your head will now be nearly facedown and no longer needs to dangle. Hold for at least 30 seconds. If you feel dizzy or have symptoms of vertigo, continue to hold the position until the symptoms stop. Sit back up. You can repeat the maneuver in 24 hours if your vertigo does not go away. Follow these instructions at home: For 24 hours after doing the Epley maneuver: Keep your head in an upright position. When lying down to sleep or rest, keep your head raised (elevated) with two or more pillows. Avoid excessive neck movements. Activity Do not drive or use machinery if you feel dizzy. After doing the Epley maneuver, return to your normal activities as told by your health care provider. Ask your health care provider what activities are safe for you. General instructions Drink enough fluid to keep your urine pale yellow. Do not drink alcohol. Take over-the-counter and prescription medicines only as told by your health care provider. Keep all follow-up visits. This is important. Preventing vertigo symptoms Ask your health care provider if there is anything you should do at home to prevent vertigo. He or she may recommend that you: Keep your head elevated with two or more pillows while you sleep. Do not sleep on the side of your affected ear. Get  up slowly from bed. Avoid sudden movements during the day. Avoid extreme head positions or movement, such as looking up or bending over. Contact a health care provider if: Your vertigo gets worse. You have other symptoms, including: Nausea. Vomiting. Headache. Get help right away if you: Have vision changes. Have a headache or neck pain that is severe or getting worse. Cannot stop vomiting. Have new numbness or weakness in any part of your body. These symptoms may represent a serious problem that is an emergency. Do not wait to see if the symptoms will go away. Get medical help right away. Call your local emergency services (911 in the [place] Do not drive yourself to the hospital. Summary Vertigo is the feeling that you or your surroundings are moving when they are not. The Epley maneuver is an exercise that relieves symptoms of vertigo. If the Epley maneuver is done correctly, it  is considered safe. This information is not intended to replace advice given to you by your health care provider. Make sure you discuss any questions you have with your health care provider. Document Revised: 03/05/2020 Document Reviewed: 03/05/2020 Elsevier Patient Education  2023 Elsevier Inc.   Brandt-Daroff Exercise for Vertigo  The Brandt-Daroff exercise is one of several exercises that can speed up the compensation process and end the symptoms of vertigo. It often is prescribed for people who have benign paroxysmal positional vertigo (BPPV) and sometimes for labyrinthitis. These exercises won't cure these conditions. But over time they can reduce symptoms of vertigo.  People who use this exercise usually are told to do several repetitions of the exercise at least twice a day.  How It Is Done To do the Brandt-Daroff exercise:  Start in an upright, seated position. Move into the lying position on one side with your nose pointed up at about a 45-degree angle. Remain in this position for about 30  seconds (or until the vertigo subsides, whichever is longer), then move back to the seated position. Repeat steps 2 and 3 on the other side. What To Expect Symptoms sometimes suddenly go away during an exercise period. More often, improvement occurs gradually over a period of weeks or months.  Why It Is Done The Brandt-Daroff exercise and other similar exercises are used to treat BPPV. These exercises are sometimes used to treat labyrinthitis or vestibular neuritis.  How Well It Works These exercises can help your body get used to the confusing signals that are causing your vertigo. This may help you get over your vertigo sooner.  The Brandt-Daroff exercise does not help relieve the symptoms of benign paroxysmal positional vertigo (BPPV) as well as the [place] maneuver or the Epley maneuver.footnote1  Risks There are no risks in doing these exercises. To avoid hitting your head or developing minor neck injuries, be careful not to lie down too quickly.  References Citations Fife TD, et al. (2008). Practice parameter: Therapies for benign paroxysmal positional vertigo (an evidence-based review). Report of the Quality Standards Subcommittee of the American Academy of Neurology. Neurology, 70(22): [phone]. Current as of: Aug 20, 2020    If you have been instructed to have an in-person evaluation today at a local Urgent Care facility, please use the link below. It will take you to a list of all of our available [facility], including address, phone number and hours of operation. Please do not delay care.  [facility]  If you or a family member do not have a primary care provider, use the link below to schedule a visit and establish care. When you choose a [facility] primary care physician or advanced practice provider, you gain a long-term partner in health. Find a Primary Care Provider  Learn more about [facility]'s in-office and virtual care options: [facility] [place]  - Get Care Now

## 2022-09-01 NOTE — Progress Notes (Signed)
Virtual Visit Consent   Tyler Hensley, you are scheduled for a virtual visit with a [facility] provider today. Just as with appointments in the office, your consent must be obtained to participate. Your consent will be active for this visit and any virtual visit you may have with one of our providers in the next 365 days. If you have a MyChart account, a copy of this consent can be sent to you electronically.  As this is a virtual visit, video technology does not allow for your provider to perform a traditional examination. This may limit your provider's ability to fully assess your condition. If your provider identifies any concerns that need to be evaluated in person or the need to arrange testing (such as labs, EKG, etc.), we will make arrangements to do so. Although advances in technology are sophisticated, we cannot ensure that it will always work on either your end or our end. If the connection with a video visit is poor, the visit may have to be switched to a telephone visit. With either a video or telephone visit, we are not always able to ensure that we have a secure connection.  By engaging in this virtual visit, you consent to the provision of healthcare and authorize for your insurance to be billed (if applicable) for the services provided during this visit. Depending on your insurance coverage, you may receive a charge related to this service.  I need to obtain your verbal consent now. Are you willing to proceed with your visit today? WAYLIN SLUIS has provided verbal consent on 09/01/2022 for a virtual visit (video or telephone). Margaretann Loveless, PA-C  Date: 09/01/2022 9:42 AM  Virtual Visit via Video Note   I, Margaretann Loveless, connected with  Tyler Hensley  ([id number], 06-19-1975) on 09/01/22 at  9:30 AM EDT by a video-enabled telemedicine application and verified that I am speaking with the correct person using two identifiers.  Location: Patient: Virtual  Visit Location Patient: Home Provider: Virtual Visit Location Provider: Home Office   I discussed the limitations of evaluation and management by telemedicine and the availability of in person appointments. The patient expressed understanding and agreed to proceed.    History of Present Illness: Tyler Hensley is a 47 y.o. who identifies as a male who was assigned male at birth, and is being seen today for dizziness.  HPI: Dizziness This is a new problem. The current episode started in the past 7 days. The problem occurs constantly. The problem has been gradually improving. Associated symptoms include fatigue, nausea (mild) and vertigo. Pertinent negatives include no abdominal pain, chest pain, chills, congestion, coughing, diaphoresis, fever, headaches, myalgias, numbness, sore throat, visual change, vomiting or weakness. Exacerbated by: movements. He has tried sleep and rest (dramamine) for the symptoms. The treatment provided significant relief.    On Monday, 08/30/22, was laying in the floor and turned his head sharply. Dizziness onset after that with room spinning. This lasted through Monday. Tuesday awoke and had some mild spinning, but had developed more off balance "felt like I was on a boat in a storm". Today, again, had similar off balance feeling. Took Dramamine this morning and has significant improvement in symptoms.  Problems: There are no problems to display for this patient.   Allergies: No Known Allergies Medications:  Current Outpatient Medications:    albuterol (VENTOLIN HFA) 108 (90 Base) MCG/ACT inhaler, Inhale 1 to 2 puffs by mouth into the lungs every 6 hours as needed  for wheezing, Disp: 18 g, Rfl: 1   diltiazem (DILT-XR) 120 MG 24 hr capsule, Take 1 capsule (120 mg total) by mouth daily., Disp: 90 capsule, Rfl: 2   diphenhydramine-acetaminophen (TYLENOL PM) 25-500 MG TABS tablet, Take 1 tablet by mouth at bedtime as needed., Disp: , Rfl:    esomeprazole (NEXIUM) 40  MG capsule, Take 1 capsule (40 mg total) by mouth daily before breakfast., Disp: 30 capsule, Rfl: 3   levofloxacin (LEVAQUIN) 500 MG tablet, Take 1 tablet (500 mg total) by mouth daily., Disp: 10 tablet, Rfl: 0   meclizine (ANTIVERT) 25 MG tablet, Take 1 tablet (25 mg total) by mouth 3 (three) times daily as needed for dizziness., Disp: 30 tablet, Rfl: 0   OVER THE COUNTER MEDICATION, dayquil, Disp: , Rfl:    oxymetazoline (AFRIN) 0.05 % nasal spray, Place 1 spray into both nostrils 2 (two) times daily., Disp: , Rfl:    pseudoephedrine (SUDAFED) 30 MG/5ML syrup, Take by mouth 4 (four) times daily as needed for congestion., Disp: , Rfl:   Observations/Objective: Patient is well-developed, well-nourished in no acute distress.  Resting comfortably  at home.  Head is normocephalic, atraumatic.  No labored breathing.  Speech is clear and coherent with logical content.  Patient is alert and oriented at baseline.    Assessment and Plan: 1. Benign paroxysmal positional vertigo, unspecified laterality - meclizine (ANTIVERT) 25 MG tablet; Take 1 tablet (25 mg total) by mouth 3 (three) times daily as needed for dizziness.  Dispense: 30 tablet; Refill: 0  - Suspect BPPV from turning head sharply - No warning signs or symptoms - Meclizine added if symptoms recur - Epley maneuver and Brandt-Daroff exercises provided in AVS - Work note - Push fluids - Rest - Seek in person evaluation if worsening  Follow Up Instructions: I discussed the assessment and treatment plan with the patient. The patient was provided an opportunity to ask questions and all were answered. The patient agreed with the plan and demonstrated an understanding of the instructions.  A copy of instructions were sent to the patient via MyChart unless otherwise noted below.     The patient was advised to call back or seek an in-person evaluation if the symptoms worsen or if the condition fails to improve as anticipated.  Time:  I  spent 15 minutes with the patient via telehealth technology discussing the above problems/concerns.    Margaretann Loveless, PA-C

## 2022-09-20 DIAGNOSIS — R3129 Other microscopic hematuria: Secondary | ICD-10-CM | POA: Diagnosis not present

## 2022-09-20 DIAGNOSIS — N5089 Other specified disorders of the male genital organs: Secondary | ICD-10-CM | POA: Diagnosis not present

## 2022-09-22 ENCOUNTER — Other Ambulatory Visit: Payer: Self-pay | Admitting: Family Medicine

## 2022-09-22 DIAGNOSIS — N5089 Other specified disorders of the male genital organs: Secondary | ICD-10-CM

## 2022-09-28 ENCOUNTER — Ambulatory Visit
Admission: RE | Admit: 2022-09-28 | Discharge: 2022-09-28 | Disposition: A | Discharge status: Home / Self Care | Payer: 59 | Source: Ambulatory Visit | Attending: Family Medicine | Admitting: Family Medicine

## 2022-09-28 DIAGNOSIS — N5089 Other specified disorders of the male genital organs: Secondary | ICD-10-CM

## 2022-09-28 DIAGNOSIS — N50811 Right testicular pain: Secondary | ICD-10-CM | POA: Diagnosis not present

## 2022-10-25 DIAGNOSIS — N50811 Right testicular pain: Secondary | ICD-10-CM | POA: Diagnosis not present

## 2022-10-28 ENCOUNTER — Other Ambulatory Visit: Payer: Self-pay | Admitting: Oncology

## 2022-10-28 DIAGNOSIS — Z006 Encounter for examination for normal comparison and control in clinical research program: Secondary | ICD-10-CM

## 2022-10-29 ENCOUNTER — Other Ambulatory Visit (HOSPITAL_COMMUNITY)
Admission: RE | Admit: 2022-10-29 | Discharge: 2022-10-29 | Disposition: A | Discharge status: Home / Self Care | Payer: 59 | Source: Ambulatory Visit | Attending: Oncology | Admitting: Oncology

## 2022-10-29 DIAGNOSIS — Z006 Encounter for examination for normal comparison and control in clinical research program: Secondary | ICD-10-CM | POA: Insufficient documentation

## 2022-12-21 ENCOUNTER — Other Ambulatory Visit (HOSPITAL_COMMUNITY): Payer: Self-pay

## 2023-01-21 ENCOUNTER — Other Ambulatory Visit (HOSPITAL_COMMUNITY): Payer: Self-pay

## 2023-01-24 ENCOUNTER — Other Ambulatory Visit (HOSPITAL_COMMUNITY): Payer: Self-pay

## 2023-01-24 ENCOUNTER — Other Ambulatory Visit: Payer: Self-pay

## 2023-01-24 MED ORDER — DILTIAZEM HCL ER 120 MG PO CP24
120.0000 mg | ORAL_CAPSULE | Freq: Every day | ORAL | 2 refills | Status: DC
  Filled 2023-01-24: qty 90, 90d supply, fill #0
  Filled 2023-05-02 – 2023-05-06 (×2): qty 90, 90d supply, fill #1
  Filled 2023-08-04: qty 90, 90d supply, fill #2

## 2023-01-28 ENCOUNTER — Other Ambulatory Visit (HOSPITAL_COMMUNITY): Payer: Self-pay

## 2023-03-14 ENCOUNTER — Other Ambulatory Visit (HOSPITAL_COMMUNITY): Payer: Self-pay

## 2023-03-14 DIAGNOSIS — E1169 Type 2 diabetes mellitus with other specified complication: Secondary | ICD-10-CM | POA: Diagnosis not present

## 2023-03-14 DIAGNOSIS — Z0000 Encounter for general adult medical examination without abnormal findings: Secondary | ICD-10-CM | POA: Diagnosis not present

## 2023-03-14 DIAGNOSIS — Z6841 Body Mass Index (BMI) 40.0 and over, adult: Secondary | ICD-10-CM | POA: Diagnosis not present

## 2023-03-14 DIAGNOSIS — Z125 Encounter for screening for malignant neoplasm of prostate: Secondary | ICD-10-CM | POA: Diagnosis not present

## 2023-03-14 DIAGNOSIS — R030 Elevated blood-pressure reading, without diagnosis of hypertension: Secondary | ICD-10-CM | POA: Diagnosis not present

## 2023-03-14 MED ORDER — MOUNJARO 2.5 MG/0.5ML ~~LOC~~ SOAJ
2.5000 mg | SUBCUTANEOUS | 0 refills | Status: AC
  Filled 2023-03-14: qty 2, 28d supply, fill #0

## 2023-04-06 DIAGNOSIS — N289 Disorder of kidney and ureter, unspecified: Secondary | ICD-10-CM | POA: Diagnosis not present

## 2023-04-14 ENCOUNTER — Other Ambulatory Visit (HOSPITAL_COMMUNITY): Payer: Self-pay

## 2023-04-15 ENCOUNTER — Other Ambulatory Visit (HOSPITAL_COMMUNITY): Payer: Self-pay

## 2023-05-06 ENCOUNTER — Other Ambulatory Visit (HOSPITAL_COMMUNITY): Payer: Self-pay

## 2023-05-06 ENCOUNTER — Encounter (HOSPITAL_COMMUNITY): Payer: Self-pay

## 2023-05-10 ENCOUNTER — Other Ambulatory Visit (HOSPITAL_COMMUNITY): Payer: Self-pay

## 2023-05-11 ENCOUNTER — Other Ambulatory Visit (HOSPITAL_COMMUNITY): Payer: Self-pay

## 2023-05-11 MED ORDER — BISACODYL 5 MG PO TBEC
10.0000 mg | DELAYED_RELEASE_TABLET | Freq: Two times a day (BID) | ORAL | 0 refills | Status: AC
  Filled 2023-05-11: qty 4, 1d supply, fill #0

## 2023-05-11 MED ORDER — PEG 3350-KCL-NA BICARB-NACL 420 G PO SOLR
ORAL | 0 refills | Status: AC
  Filled 2023-05-11: qty 4000, 1d supply, fill #0

## 2023-05-12 ENCOUNTER — Other Ambulatory Visit (HOSPITAL_COMMUNITY): Payer: Self-pay

## 2023-05-20 DIAGNOSIS — D124 Benign neoplasm of descending colon: Secondary | ICD-10-CM | POA: Diagnosis not present

## 2023-05-20 DIAGNOSIS — D125 Benign neoplasm of sigmoid colon: Secondary | ICD-10-CM | POA: Diagnosis not present

## 2023-05-20 DIAGNOSIS — Z1211 Encounter for screening for malignant neoplasm of colon: Secondary | ICD-10-CM | POA: Diagnosis not present

## 2023-05-26 ENCOUNTER — Other Ambulatory Visit (HOSPITAL_COMMUNITY): Payer: Self-pay

## 2023-05-26 MED ORDER — MOUNJARO 5 MG/0.5ML ~~LOC~~ SOAJ
5.0000 mg | SUBCUTANEOUS | 0 refills | Status: DC
  Filled 2023-06-01: qty 2, 28d supply, fill #0

## 2023-06-01 ENCOUNTER — Other Ambulatory Visit (HOSPITAL_COMMUNITY): Payer: Self-pay

## 2023-06-03 ENCOUNTER — Other Ambulatory Visit (HOSPITAL_COMMUNITY): Payer: Self-pay

## 2023-06-14 DIAGNOSIS — E1169 Type 2 diabetes mellitus with other specified complication: Secondary | ICD-10-CM | POA: Diagnosis not present

## 2023-06-14 DIAGNOSIS — Z6841 Body Mass Index (BMI) 40.0 and over, adult: Secondary | ICD-10-CM | POA: Diagnosis not present

## 2023-06-14 DIAGNOSIS — R030 Elevated blood-pressure reading, without diagnosis of hypertension: Secondary | ICD-10-CM | POA: Diagnosis not present

## 2023-06-27 ENCOUNTER — Other Ambulatory Visit (HOSPITAL_COMMUNITY): Payer: Self-pay

## 2023-06-28 ENCOUNTER — Other Ambulatory Visit (HOSPITAL_COMMUNITY): Payer: Self-pay

## 2023-07-01 ENCOUNTER — Other Ambulatory Visit (HOSPITAL_COMMUNITY): Payer: Self-pay

## 2023-07-12 ENCOUNTER — Other Ambulatory Visit (HOSPITAL_COMMUNITY): Payer: Self-pay

## 2023-07-12 MED ORDER — MOUNJARO 5 MG/0.5ML ~~LOC~~ SOAJ
5.0000 mg | SUBCUTANEOUS | 0 refills | Status: AC
  Filled 2023-07-12: qty 2, 28d supply, fill #0

## 2023-07-23 ENCOUNTER — Other Ambulatory Visit (HOSPITAL_COMMUNITY): Payer: Self-pay

## 2023-08-22 ENCOUNTER — Other Ambulatory Visit (HOSPITAL_COMMUNITY): Payer: Self-pay

## 2023-08-29 ENCOUNTER — Other Ambulatory Visit (HOSPITAL_COMMUNITY): Payer: Self-pay

## 2023-08-29 MED ORDER — MOUNJARO 7.5 MG/0.5ML ~~LOC~~ SOAJ
7.5000 mg | SUBCUTANEOUS | 0 refills | Status: DC
  Filled 2023-08-29: qty 2, 28d supply, fill #0

## 2023-09-13 DIAGNOSIS — R030 Elevated blood-pressure reading, without diagnosis of hypertension: Secondary | ICD-10-CM | POA: Diagnosis not present

## 2023-09-13 DIAGNOSIS — E1169 Type 2 diabetes mellitus with other specified complication: Secondary | ICD-10-CM | POA: Diagnosis not present

## 2023-10-21 ENCOUNTER — Other Ambulatory Visit (HOSPITAL_COMMUNITY): Payer: Self-pay

## 2023-10-25 ENCOUNTER — Other Ambulatory Visit (HOSPITAL_COMMUNITY): Payer: Self-pay

## 2023-10-25 MED ORDER — MOUNJARO 7.5 MG/0.5ML ~~LOC~~ SOAJ
7.5000 mg | SUBCUTANEOUS | 0 refills | Status: AC
  Filled 2023-10-25: qty 2, 28d supply, fill #0

## 2023-11-16 ENCOUNTER — Other Ambulatory Visit (HOSPITAL_COMMUNITY): Payer: Self-pay

## 2023-11-16 MED ORDER — DILTIAZEM HCL ER 120 MG PO CP24
120.0000 mg | ORAL_CAPSULE | Freq: Every day | ORAL | 2 refills | Status: AC
  Filled 2023-11-16: qty 90, 90d supply, fill #0
  Filled 2024-03-11: qty 90, 90d supply, fill #1

## 2023-11-23 ENCOUNTER — Other Ambulatory Visit (HOSPITAL_COMMUNITY): Payer: Self-pay

## 2023-12-01 ENCOUNTER — Encounter (HOSPITAL_COMMUNITY): Payer: Self-pay

## 2023-12-01 ENCOUNTER — Other Ambulatory Visit (HOSPITAL_COMMUNITY): Payer: Self-pay

## 2023-12-03 ENCOUNTER — Other Ambulatory Visit (HOSPITAL_COMMUNITY): Payer: Self-pay

## 2023-12-03 MED ORDER — MOUNJARO 10 MG/0.5ML ~~LOC~~ SOAJ
10.0000 mg | SUBCUTANEOUS | 2 refills | Status: DC
  Filled 2023-12-03: qty 2, 28d supply, fill #0
  Filled 2024-01-04: qty 2, 28d supply, fill #1
  Filled 2024-02-08: qty 2, 28d supply, fill #2

## 2023-12-15 DIAGNOSIS — R030 Elevated blood-pressure reading, without diagnosis of hypertension: Secondary | ICD-10-CM | POA: Diagnosis not present

## 2023-12-15 DIAGNOSIS — E1169 Type 2 diabetes mellitus with other specified complication: Secondary | ICD-10-CM | POA: Diagnosis not present

## 2024-03-11 ENCOUNTER — Other Ambulatory Visit (HOSPITAL_COMMUNITY): Payer: Self-pay

## 2024-03-12 ENCOUNTER — Other Ambulatory Visit: Payer: Self-pay

## 2024-03-28 ENCOUNTER — Other Ambulatory Visit (HOSPITAL_COMMUNITY): Payer: Self-pay

## 2024-03-28 MED ORDER — MOUNJARO 10 MG/0.5ML ~~LOC~~ SOAJ
10.0000 mg | SUBCUTANEOUS | 0 refills | Status: DC
  Filled 2024-03-28: qty 2, 28d supply, fill #0

## 2024-04-02 DIAGNOSIS — Z125 Encounter for screening for malignant neoplasm of prostate: Secondary | ICD-10-CM | POA: Diagnosis not present

## 2024-04-02 DIAGNOSIS — R030 Elevated blood-pressure reading, without diagnosis of hypertension: Secondary | ICD-10-CM | POA: Diagnosis not present

## 2024-04-02 DIAGNOSIS — Z23 Encounter for immunization: Secondary | ICD-10-CM | POA: Diagnosis not present

## 2024-04-02 DIAGNOSIS — Z0000 Encounter for general adult medical examination without abnormal findings: Secondary | ICD-10-CM | POA: Diagnosis not present

## 2024-04-02 DIAGNOSIS — E1169 Type 2 diabetes mellitus with other specified complication: Secondary | ICD-10-CM | POA: Diagnosis not present

## 2024-04-26 ENCOUNTER — Other Ambulatory Visit (HOSPITAL_COMMUNITY): Payer: Self-pay

## 2024-04-26 MED ORDER — MOUNJARO 15 MG/0.5ML ~~LOC~~ SOAJ
15.0000 mg | SUBCUTANEOUS | 0 refills | Status: AC
  Filled 2024-04-26: qty 2, 28d supply, fill #0
